# Patient Record
Sex: Female | Born: 1935 | Race: White | Hispanic: No | Marital: Single | Smoking: Never smoker
Health system: Southern US, Community
[De-identification: ages and names within clinical notes are randomized; demographics above are authoritative.]

## PROBLEM LIST (undated history)

## (undated) DIAGNOSIS — IMO0001 Reserved for inherently not codable concepts without codable children: Secondary | ICD-10-CM

## (undated) DIAGNOSIS — C50211 Malignant neoplasm of upper-inner quadrant of right female breast: Secondary | ICD-10-CM

## (undated) DIAGNOSIS — J189 Pneumonia, unspecified organism: Secondary | ICD-10-CM

## (undated) DIAGNOSIS — IMO0002 Reserved for concepts with insufficient information to code with codable children: Secondary | ICD-10-CM

## (undated) DIAGNOSIS — H9190 Unspecified hearing loss, unspecified ear: Secondary | ICD-10-CM

## (undated) DIAGNOSIS — C50919 Malignant neoplasm of unspecified site of unspecified female breast: Secondary | ICD-10-CM

## (undated) HISTORY — DX: Reserved for inherently not codable concepts without codable children: IMO0001

## (undated) HISTORY — DX: Reserved for concepts with insufficient information to code with codable children: IMO0002

## (undated) HISTORY — PX: WISDOM TOOTH EXTRACTION: SHX21

## (undated) HISTORY — PX: MASTECTOMY: SHX3

## (undated) HISTORY — PX: BREAST BIOPSY: SHX20

## (undated) HISTORY — DX: Malignant neoplasm of upper-inner quadrant of right female breast: C50.211

---
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
## 1979-12-08 HISTORY — PX: ABDOMINAL HYSTERECTOMY: SHX81

## 1979-12-08 HISTORY — PX: APPENDECTOMY: SHX54

## 2012-02-11 DIAGNOSIS — H538 Other visual disturbances: Secondary | ICD-10-CM | POA: Diagnosis not present

## 2012-02-11 DIAGNOSIS — H5315 Visual distortions of shape and size: Secondary | ICD-10-CM | POA: Diagnosis not present

## 2012-02-11 DIAGNOSIS — H259 Unspecified age-related cataract: Secondary | ICD-10-CM | POA: Diagnosis not present

## 2012-02-11 DIAGNOSIS — H35729 Serous detachment of retinal pigment epithelium, unspecified eye: Secondary | ICD-10-CM | POA: Diagnosis not present

## 2012-02-16 DIAGNOSIS — H43819 Vitreous degeneration, unspecified eye: Secondary | ICD-10-CM | POA: Diagnosis not present

## 2012-02-16 DIAGNOSIS — H353290 Exudative age-related macular degeneration, unspecified eye, stage unspecified: Secondary | ICD-10-CM | POA: Diagnosis not present

## 2012-02-16 DIAGNOSIS — H353190 Nonexudative age-related macular degeneration, unspecified eye, stage unspecified: Secondary | ICD-10-CM | POA: Diagnosis not present

## 2012-03-24 DIAGNOSIS — H353290 Exudative age-related macular degeneration, unspecified eye, stage unspecified: Secondary | ICD-10-CM | POA: Diagnosis not present

## 2012-04-21 DIAGNOSIS — H353290 Exudative age-related macular degeneration, unspecified eye, stage unspecified: Secondary | ICD-10-CM | POA: Diagnosis not present

## 2012-05-12 DIAGNOSIS — H353190 Nonexudative age-related macular degeneration, unspecified eye, stage unspecified: Secondary | ICD-10-CM | POA: Diagnosis not present

## 2012-05-12 DIAGNOSIS — H2510 Age-related nuclear cataract, unspecified eye: Secondary | ICD-10-CM | POA: Diagnosis not present

## 2012-05-12 DIAGNOSIS — H353290 Exudative age-related macular degeneration, unspecified eye, stage unspecified: Secondary | ICD-10-CM | POA: Diagnosis not present

## 2012-06-02 DIAGNOSIS — H353290 Exudative age-related macular degeneration, unspecified eye, stage unspecified: Secondary | ICD-10-CM | POA: Diagnosis not present

## 2012-06-30 DIAGNOSIS — H353290 Exudative age-related macular degeneration, unspecified eye, stage unspecified: Secondary | ICD-10-CM | POA: Diagnosis not present

## 2012-07-28 DIAGNOSIS — H353290 Exudative age-related macular degeneration, unspecified eye, stage unspecified: Secondary | ICD-10-CM | POA: Diagnosis not present

## 2012-08-23 DIAGNOSIS — H353290 Exudative age-related macular degeneration, unspecified eye, stage unspecified: Secondary | ICD-10-CM | POA: Diagnosis not present

## 2012-09-15 DIAGNOSIS — H353290 Exudative age-related macular degeneration, unspecified eye, stage unspecified: Secondary | ICD-10-CM | POA: Diagnosis not present

## 2012-10-12 DIAGNOSIS — H25019 Cortical age-related cataract, unspecified eye: Secondary | ICD-10-CM | POA: Diagnosis not present

## 2012-10-12 DIAGNOSIS — H43819 Vitreous degeneration, unspecified eye: Secondary | ICD-10-CM | POA: Diagnosis not present

## 2012-10-12 DIAGNOSIS — H5315 Visual distortions of shape and size: Secondary | ICD-10-CM | POA: Diagnosis not present

## 2012-10-12 DIAGNOSIS — H3530 Unspecified macular degeneration: Secondary | ICD-10-CM | POA: Diagnosis not present

## 2012-10-13 DIAGNOSIS — H353290 Exudative age-related macular degeneration, unspecified eye, stage unspecified: Secondary | ICD-10-CM | POA: Diagnosis not present

## 2012-11-10 DIAGNOSIS — H353290 Exudative age-related macular degeneration, unspecified eye, stage unspecified: Secondary | ICD-10-CM | POA: Diagnosis not present

## 2013-03-14 DIAGNOSIS — H353290 Exudative age-related macular degeneration, unspecified eye, stage unspecified: Secondary | ICD-10-CM | POA: Diagnosis not present

## 2013-04-11 DIAGNOSIS — H353290 Exudative age-related macular degeneration, unspecified eye, stage unspecified: Secondary | ICD-10-CM | POA: Diagnosis not present

## 2013-05-09 DIAGNOSIS — H353290 Exudative age-related macular degeneration, unspecified eye, stage unspecified: Secondary | ICD-10-CM | POA: Diagnosis not present

## 2013-06-13 DIAGNOSIS — H353290 Exudative age-related macular degeneration, unspecified eye, stage unspecified: Secondary | ICD-10-CM | POA: Diagnosis not present

## 2013-07-11 DIAGNOSIS — H353290 Exudative age-related macular degeneration, unspecified eye, stage unspecified: Secondary | ICD-10-CM | POA: Diagnosis not present

## 2013-07-14 DIAGNOSIS — H43819 Vitreous degeneration, unspecified eye: Secondary | ICD-10-CM | POA: Diagnosis not present

## 2013-07-14 DIAGNOSIS — H259 Unspecified age-related cataract: Secondary | ICD-10-CM | POA: Diagnosis not present

## 2013-07-14 DIAGNOSIS — H3530 Unspecified macular degeneration: Secondary | ICD-10-CM | POA: Diagnosis not present

## 2013-08-08 DIAGNOSIS — H353290 Exudative age-related macular degeneration, unspecified eye, stage unspecified: Secondary | ICD-10-CM | POA: Diagnosis not present

## 2013-09-05 DIAGNOSIS — H353290 Exudative age-related macular degeneration, unspecified eye, stage unspecified: Secondary | ICD-10-CM | POA: Diagnosis not present

## 2013-10-03 DIAGNOSIS — H353290 Exudative age-related macular degeneration, unspecified eye, stage unspecified: Secondary | ICD-10-CM | POA: Diagnosis not present

## 2013-11-07 DIAGNOSIS — H353290 Exudative age-related macular degeneration, unspecified eye, stage unspecified: Secondary | ICD-10-CM | POA: Diagnosis not present

## 2013-12-05 DIAGNOSIS — H353290 Exudative age-related macular degeneration, unspecified eye, stage unspecified: Secondary | ICD-10-CM | POA: Diagnosis not present

## 2014-01-02 DIAGNOSIS — H353290 Exudative age-related macular degeneration, unspecified eye, stage unspecified: Secondary | ICD-10-CM | POA: Diagnosis not present

## 2014-01-30 DIAGNOSIS — H353290 Exudative age-related macular degeneration, unspecified eye, stage unspecified: Secondary | ICD-10-CM | POA: Diagnosis not present

## 2014-02-27 DIAGNOSIS — H353290 Exudative age-related macular degeneration, unspecified eye, stage unspecified: Secondary | ICD-10-CM | POA: Diagnosis not present

## 2014-03-27 DIAGNOSIS — H353290 Exudative age-related macular degeneration, unspecified eye, stage unspecified: Secondary | ICD-10-CM | POA: Diagnosis not present

## 2014-04-24 DIAGNOSIS — H353190 Nonexudative age-related macular degeneration, unspecified eye, stage unspecified: Secondary | ICD-10-CM | POA: Diagnosis not present

## 2014-04-24 DIAGNOSIS — H353290 Exudative age-related macular degeneration, unspecified eye, stage unspecified: Secondary | ICD-10-CM | POA: Diagnosis not present

## 2014-05-01 DIAGNOSIS — H3530 Unspecified macular degeneration: Secondary | ICD-10-CM | POA: Diagnosis not present

## 2014-05-01 DIAGNOSIS — H524 Presbyopia: Secondary | ICD-10-CM | POA: Diagnosis not present

## 2014-05-01 DIAGNOSIS — H5200 Hypermetropia, unspecified eye: Secondary | ICD-10-CM | POA: Diagnosis not present

## 2014-05-01 DIAGNOSIS — H259 Unspecified age-related cataract: Secondary | ICD-10-CM | POA: Diagnosis not present

## 2014-05-24 DIAGNOSIS — H353290 Exudative age-related macular degeneration, unspecified eye, stage unspecified: Secondary | ICD-10-CM | POA: Diagnosis not present

## 2014-06-18 DIAGNOSIS — H353290 Exudative age-related macular degeneration, unspecified eye, stage unspecified: Secondary | ICD-10-CM | POA: Diagnosis not present

## 2014-07-17 DIAGNOSIS — H353290 Exudative age-related macular degeneration, unspecified eye, stage unspecified: Secondary | ICD-10-CM | POA: Diagnosis not present

## 2014-08-14 DIAGNOSIS — H353290 Exudative age-related macular degeneration, unspecified eye, stage unspecified: Secondary | ICD-10-CM | POA: Diagnosis not present

## 2014-08-14 DIAGNOSIS — H2510 Age-related nuclear cataract, unspecified eye: Secondary | ICD-10-CM | POA: Diagnosis not present

## 2014-08-14 DIAGNOSIS — H353190 Nonexudative age-related macular degeneration, unspecified eye, stage unspecified: Secondary | ICD-10-CM | POA: Diagnosis not present

## 2014-09-11 DIAGNOSIS — H3532 Exudative age-related macular degeneration: Secondary | ICD-10-CM | POA: Diagnosis not present

## 2014-10-10 DIAGNOSIS — H3532 Exudative age-related macular degeneration: Secondary | ICD-10-CM | POA: Diagnosis not present

## 2014-10-10 DIAGNOSIS — H3531 Nonexudative age-related macular degeneration: Secondary | ICD-10-CM | POA: Diagnosis not present

## 2014-10-15 DIAGNOSIS — H3532 Exudative age-related macular degeneration: Secondary | ICD-10-CM | POA: Diagnosis not present

## 2014-11-13 DIAGNOSIS — H3532 Exudative age-related macular degeneration: Secondary | ICD-10-CM | POA: Diagnosis not present

## 2014-12-12 DIAGNOSIS — H43813 Vitreous degeneration, bilateral: Secondary | ICD-10-CM | POA: Diagnosis not present

## 2014-12-12 DIAGNOSIS — H3531 Nonexudative age-related macular degeneration: Secondary | ICD-10-CM | POA: Diagnosis not present

## 2014-12-12 DIAGNOSIS — H3532 Exudative age-related macular degeneration: Secondary | ICD-10-CM | POA: Diagnosis not present

## 2014-12-20 DIAGNOSIS — N649 Disorder of breast, unspecified: Secondary | ICD-10-CM | POA: Diagnosis not present

## 2014-12-20 DIAGNOSIS — N6452 Nipple discharge: Secondary | ICD-10-CM | POA: Diagnosis not present

## 2015-01-09 ENCOUNTER — Other Ambulatory Visit: Payer: Self-pay | Admitting: Obstetrics and Gynecology

## 2015-01-09 DIAGNOSIS — N6452 Nipple discharge: Secondary | ICD-10-CM

## 2015-01-11 DIAGNOSIS — H43813 Vitreous degeneration, bilateral: Secondary | ICD-10-CM | POA: Diagnosis not present

## 2015-01-11 DIAGNOSIS — H3532 Exudative age-related macular degeneration: Secondary | ICD-10-CM | POA: Diagnosis not present

## 2015-01-11 DIAGNOSIS — H3531 Nonexudative age-related macular degeneration: Secondary | ICD-10-CM | POA: Diagnosis not present

## 2015-01-16 ENCOUNTER — Ambulatory Visit
Admission: RE | Admit: 2015-01-16 | Discharge: 2015-01-16 | Disposition: A | Discharge status: Home / Self Care | Payer: Medicare Other | Source: Ambulatory Visit | Attending: Obstetrics and Gynecology | Admitting: Obstetrics and Gynecology

## 2015-01-16 ENCOUNTER — Ambulatory Visit: Payer: Medicare Other

## 2015-01-16 ENCOUNTER — Other Ambulatory Visit: Payer: Self-pay | Admitting: Obstetrics and Gynecology

## 2015-01-16 DIAGNOSIS — N6452 Nipple discharge: Secondary | ICD-10-CM

## 2015-01-16 DIAGNOSIS — C50411 Malignant neoplasm of upper-outer quadrant of right female breast: Secondary | ICD-10-CM | POA: Diagnosis not present

## 2015-01-16 DIAGNOSIS — N63 Unspecified lump in breast: Secondary | ICD-10-CM | POA: Diagnosis not present

## 2015-01-16 DIAGNOSIS — C50919 Malignant neoplasm of unspecified site of unspecified female breast: Secondary | ICD-10-CM

## 2015-01-16 DIAGNOSIS — D0511 Intraductal carcinoma in situ of right breast: Secondary | ICD-10-CM | POA: Diagnosis not present

## 2015-01-16 HISTORY — DX: Malignant neoplasm of unspecified site of unspecified female breast: C50.919

## 2015-01-17 ENCOUNTER — Ambulatory Visit
Admission: RE | Admit: 2015-01-17 | Discharge: 2015-01-17 | Disposition: A | Discharge status: Home / Self Care | Payer: Medicare Other | Source: Ambulatory Visit | Attending: Obstetrics and Gynecology | Admitting: Obstetrics and Gynecology

## 2015-01-17 DIAGNOSIS — N6452 Nipple discharge: Secondary | ICD-10-CM

## 2015-01-18 ENCOUNTER — Ambulatory Visit
Admission: RE | Admit: 2015-01-18 | Discharge: 2015-01-18 | Disposition: A | Discharge status: Home / Self Care | Payer: Medicare Other | Source: Ambulatory Visit | Attending: Obstetrics and Gynecology | Admitting: Obstetrics and Gynecology

## 2015-01-18 ENCOUNTER — Other Ambulatory Visit: Payer: Self-pay | Admitting: Obstetrics and Gynecology

## 2015-01-18 DIAGNOSIS — C50911 Malignant neoplasm of unspecified site of right female breast: Secondary | ICD-10-CM

## 2015-01-18 DIAGNOSIS — D0511 Intraductal carcinoma in situ of right breast: Secondary | ICD-10-CM

## 2015-01-18 DIAGNOSIS — C50212 Malignant neoplasm of upper-inner quadrant of left female breast: Secondary | ICD-10-CM | POA: Diagnosis not present

## 2015-01-22 ENCOUNTER — Telehealth: Payer: Self-pay | Admitting: *Deleted

## 2015-01-22 ENCOUNTER — Encounter: Payer: Self-pay | Admitting: *Deleted

## 2015-01-22 DIAGNOSIS — C50211 Malignant neoplasm of upper-inner quadrant of right female breast: Secondary | ICD-10-CM

## 2015-01-22 HISTORY — DX: Malignant neoplasm of upper-inner quadrant of right female breast: C50.211

## 2015-01-22 NOTE — Telephone Encounter (Signed)
Confirmed [facility] for 01/30/15 at 1200 .  Instructions and contact information given. 

## 2015-01-28 ENCOUNTER — Ambulatory Visit
Admission: RE | Admit: 2015-01-28 | Discharge: 2015-01-28 | Disposition: A | Discharge status: Home / Self Care | Payer: Medicare Other | Source: Ambulatory Visit | Attending: Obstetrics and Gynecology | Admitting: Obstetrics and Gynecology

## 2015-01-28 DIAGNOSIS — C50911 Malignant neoplasm of unspecified site of right female breast: Secondary | ICD-10-CM

## 2015-01-28 DIAGNOSIS — N6452 Nipple discharge: Secondary | ICD-10-CM | POA: Diagnosis not present

## 2015-01-28 DIAGNOSIS — D0511 Intraductal carcinoma in situ of right breast: Secondary | ICD-10-CM | POA: Diagnosis not present

## 2015-01-28 DIAGNOSIS — C50411 Malignant neoplasm of upper-outer quadrant of right female breast: Secondary | ICD-10-CM | POA: Diagnosis not present

## 2015-01-28 MED ORDER — GADOBENATE DIMEGLUMINE 529 MG/ML IV SOLN
15.0000 mL | Freq: Once | INTRAVENOUS | Status: AC | PRN
  Administered 2015-01-28: 15 mL via INTRAVENOUS

## 2015-01-30 ENCOUNTER — Ambulatory Visit
Admission: RE | Admit: 2015-01-30 | Discharge: 2015-01-30 | Disposition: A | Discharge status: Home / Self Care | Payer: Medicare Other | Source: Ambulatory Visit | Attending: Radiation Oncology | Admitting: Radiation Oncology

## 2015-01-30 ENCOUNTER — Encounter: Payer: Self-pay | Admitting: Oncology

## 2015-01-30 ENCOUNTER — Ambulatory Visit: Payer: Medicare Other

## 2015-01-30 ENCOUNTER — Other Ambulatory Visit: Payer: Self-pay | Admitting: General Surgery

## 2015-01-30 ENCOUNTER — Other Ambulatory Visit (HOSPITAL_BASED_OUTPATIENT_CLINIC_OR_DEPARTMENT_OTHER): Payer: Medicare Other

## 2015-01-30 ENCOUNTER — Ambulatory Visit: Payer: Medicare Other | Admitting: Physical Therapy

## 2015-01-30 ENCOUNTER — Ambulatory Visit (HOSPITAL_BASED_OUTPATIENT_CLINIC_OR_DEPARTMENT_OTHER): Payer: Medicare Other | Admitting: Oncology

## 2015-01-30 VITALS — BP 149/78 | HR 96 | Temp 98.2°F | Resp 18 | Ht 62.0 in | Wt 167.6 lb

## 2015-01-30 DIAGNOSIS — C50411 Malignant neoplasm of upper-outer quadrant of right female breast: Secondary | ICD-10-CM

## 2015-01-30 DIAGNOSIS — C50211 Malignant neoplasm of upper-inner quadrant of right female breast: Secondary | ICD-10-CM

## 2015-01-30 DIAGNOSIS — Z7982 Long term (current) use of aspirin: Secondary | ICD-10-CM | POA: Diagnosis not present

## 2015-01-30 DIAGNOSIS — C50911 Malignant neoplasm of unspecified site of right female breast: Secondary | ICD-10-CM

## 2015-01-30 DIAGNOSIS — C50919 Malignant neoplasm of unspecified site of unspecified female breast: Secondary | ICD-10-CM

## 2015-01-30 DIAGNOSIS — Z90710 Acquired absence of both cervix and uterus: Secondary | ICD-10-CM | POA: Diagnosis not present

## 2015-01-30 DIAGNOSIS — L400 Psoriasis vulgaris: Secondary | ICD-10-CM | POA: Insufficient documentation

## 2015-01-30 DIAGNOSIS — Z170 Estrogen receptor positive status [ER+]: Secondary | ICD-10-CM

## 2015-01-30 DIAGNOSIS — H3530 Unspecified macular degeneration: Secondary | ICD-10-CM | POA: Diagnosis not present

## 2015-01-30 LAB — CBC WITH DIFFERENTIAL/PLATELET
BASO%: 1 % (ref 0.0–2.0)
Basophils Absolute: 0.1 10*3/uL (ref 0.0–0.1)
EOS%: 1.7 % (ref 0.0–7.0)
Eosinophils Absolute: 0.1 10*3/uL (ref 0.0–0.5)
HCT: 45.1 % (ref 34.8–46.6)
HGB: 14.5 g/dL (ref 11.6–15.9)
LYMPH%: 21.9 % (ref 14.0–49.7)
MCH: 28.4 pg (ref 25.1–34.0)
MCHC: 32.2 g/dL (ref 31.5–36.0)
MCV: 88.3 fL (ref 79.5–101.0)
MONO#: 0.6 10*3/uL (ref 0.1–0.9)
MONO%: 8.6 % (ref 0.0–14.0)
NEUT#: 4.7 10*3/uL (ref 1.5–6.5)
NEUT%: 66.8 % (ref 38.4–76.8)
Platelets: 247 10*3/uL (ref 145–400)
RBC: 5.12 10*6/uL (ref 3.70–5.45)
RDW: 13.6 % (ref 11.2–14.5)
WBC: 7.1 10*3/uL (ref 3.9–10.3)
lymph#: 1.5 10*3/uL (ref 0.9–3.3)

## 2015-01-30 LAB — COMPREHENSIVE METABOLIC PANEL (CC13)
ALT: 12 U/L (ref 0–55)
AST: 19 U/L (ref 5–34)
Albumin: 3.7 g/dL (ref 3.5–5.0)
Alkaline Phosphatase: 77 U/L (ref 40–150)
Anion Gap: 11 mEq/L (ref 3–11)
BILIRUBIN TOTAL: 0.59 mg/dL (ref 0.20–1.20)
BUN: 12.6 mg/dL (ref 7.0–26.0)
CHLORIDE: 106 meq/L (ref 98–109)
CO2: 25 mEq/L (ref 22–29)
Calcium: 9.7 mg/dL (ref 8.4–10.4)
Creatinine: 0.8 mg/dL (ref 0.6–1.1)
EGFR: 67 mL/min/{1.73_m2} — AB (ref >90)
Glucose: 101 mg/dl (ref 70–140)
POTASSIUM: 4.4 meq/L (ref 3.5–5.1)
SODIUM: 142 meq/L (ref 136–145)
TOTAL PROTEIN: 6.7 g/dL (ref 6.4–8.3)

## 2015-01-30 NOTE — Progress Notes (Signed)
Regina Cardenas  Telephone:([phone]) [phone] Fax:(336) [phone]     ID: Regina Cardenas DOB: 01/10/1929  MR#: [id number]  WTU#:[id number]  Patient Care Team: No Pcp Per Patient as PCP - General (General Practice) Fanny Skates, MD as Consulting Physician (General Surgery) Chauncey Cruel, MD as Consulting Physician (Oncology) Jodelle Gross, MD as Consulting Physician (Radiation Oncology) Roselee Culver, RN as Registered Nurse Woodworth, RN as Registered Nurse PCP: No PCP Per Patient GYN: Gus Height MD SU:  OTHER MD: Juanito Doom MD  CHIEF COMPLAINT:  Estrogen receptor positive breast cancer  CURRENT TREATMENT:  Awaiting definitive surgery   BREAST CANCER HISTORY:  Regina Cardenas  Noted some drainage on her bed sheets sometime in October 2015. She  Found it was coming from her right nipple, and treated with Neosporin. That cleared it for the next 2 months, but the problem resumed in January and at that point she decided to seek medical help. She saw Dr. Harrington Challenger and was referred to the breast Center where on 01/16/2015 she underwent bilateral diagnostic mammography with right breast ultrasonography. The breast density was category B. The left breast was unremarkable. In the upper outer quadrant of the right breast there were coarse pleomorphic calcifications spanning 9.6 cm. There was associated asymmetry and in the same quadrant he was a spiculated mass measuring 1.9 cm. A second mass anterior to this by almost 2 inches measured 1.0 cm. There was also thickening of the right nipple mammographically. On exam, the right breast mass at 10:00 was palpable and there was erythema of the right nipple concerning for Paget's disease. [facility] ultrasound showed an irregular hypoechoic mass in the right breast at the 10:00 position 7 cm from the nipple measuring 1.3 cm. There were numerous smaller hypoechoic nodules extending towards the nipple including a hypoechoic mass at 10:00 4  cm from the nipple measuring 0.7 cm and another one 6 cm from the nipple measuring 0.6 cm. The right axilla did not show any abnormal adenopathy.   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  Bilateral breast MRI obtained 01/28/2015 again showed no abnormality in the left breast and no abnormal appearing lymph nodes. In the right breast there was thickening of the nipple an asymmetric regional enhancement in the upper outer quadrant measuring up to 11.6 cm anterior posteriorly. In addition imaging through the upper abdomen showed an indeterminate 1.47 cm lesion in the left  Hepatic lobe.    the patient's subsequent history is as detailed below  INTERVAL HISTORY: Regina Cardenas  Was evaluated in the multidisciplinary breast cancer clinic 01/30/2015 accompanied by her daughter Regina Cardenas. The patient's case was also presented at the multidisciplinary breast cancer conference that same morning. A preliminary plan was suggested including initial mastectomy with radiation therapy plans depending on the pathology results, local treatment to be followed by antiestrogen therapy.  REVIEW OF SYSTEMS:   Aside from the nipple discharge itself, there were no specific symptoms leading to the original mammogram, which was routinely scheduled. The patient denies unusual headaches, visual changes, nausea, vomiting, stiff neck, dizziness, or gait imbalance. There has been no cough, phlegm production, or pleurisy, no chest pain or pressure, and no change in bowel or bladder habits. The patient denies fever, rash, bleeding, unexplained fatigue or unexplained weight loss.  Maree does have a history of psoriasis. A detailed review of systems was otherwise entirely negative.  PAST MEDICAL HISTORY: Past  Medical History  Diagnosis Date  . Breast cancer of upper-inner quadrant of right female breast 01/22/2015    PAST SURGICAL HISTORY: Past Surgical History  Procedure Laterality Date  . Abdominal hysterectomy    . Mouth surgery      FAMILY HISTORY Family History  Problem Relation Age of Onset  . Rectal cancer Maternal Aunt   . Brain cancer Maternal Aunt   . Non-Hodgkin's lymphoma Maternal Uncle   . Stomach cancer Maternal Grandmother   . Breast cancer Maternal Aunt    Patient's father died at the age of 66 following a stroke. The patient's mother died just short of 16. The patient had one brother, no sisters. On the mother's side , out of 11 siblings, there was an aunt with breast cancer in her 38s,  And other & uncles with brain, lung, and  Non-Hodgkin's lymphoma. The patient's maternal grandmother had stomach cancer. None of these patients were diagnosed at an early age as far as the patient knows. On the father's side 1 paternal aunt had "some kind of cancer ". She was not a young person when diagnosed  GYNECOLOGIC HISTORY:  No LMP recorded.  menarche age 81,  The patient is GX P0. She underwent total abdominal hysterectomy with bilateral stopping the oophorectomy at age 37. She took hormone replacement for a few months.  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 She is widowed and lives alone with her spitz, [place].  Her 2 children are adopted. Regina Cardenas lives in [place] where he works as an Marine scientist. 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 The patient has 2 grandchildren. "In my heart I am an Ep[employer]alian".    ADVANCED DIRECTIVES:  Not in place. During her [phone] visit the patient was given the appropriate documents 2 complete and notarize at her discretion.   HEALTH MAINTENANCE: History  Substance Use Topics  . Smoking status: Never Smoker   . Smokeless tobacco: Not on file  . Alcohol  Use: No     Colonoscopy:  PAP: status post hysterectomy  Bone density:  Lipid panel:  No Known Allergies  Current Outpatient Prescriptions  Medication Sig Dispense Refill  . Aflibercept (EYLEA IO) Inject into the eye Nightly. Every 4 weeks    . aspirin 325 MG EC tablet Take 325 mg by mouth daily.    . Multiple Vitamins-Minerals (ICAPS MV) TABS Take by mouth daily.    . Multiple Vitamins-Minerals (PRESERVISION AREDS 2 PO) Take 2 tablets by mouth daily.    Marland Kitchen tobramycin-dexamethasone (TOBRADEX) ophthalmic solution Place 1 drop into both eyes every 4 (four) hours while awake. Use before Eylea treatment     No current facility-administered medications for this visit.    OBJECTIVE:  Elderly white woman in no acute distress Filed Vitals:   01/30/15 1253  BP: 149/78  Pulse: 96  Temp: 98.2 F (36.8 C)  Resp: 18     Body mass index is 30.65 kg/(m^2).    ECOG FS:0 - Asymptomatic  Ocular: Sclerae unicteric,  EOMs intact Ear-nose-throat: Oropharynx clear and moist Lymphatic: No cervical or supraclavicular adenopathy Lungs no rales or rhonchi, good excursion bilaterally Heart regular rate and rhythm, no murmur appreciated Abd soft, nontender, positive bowel sounds MSK no focal spinal tenderness, no upper extremity lymphedema Neuro: non-focal,  well-oriented, appropriate affect Breasts:  The right breast is status post recent biopsy. There is moderate ecchymosis, but no palpable mass. The nipple appears slightly crusted over. It was biopsied earlier today. The left axilla is benign. The right breast is unremarkable     LAB RESULTS:  CMP     Component Value Date/Time   NA 142 01/30/2015 1233   K 4.4 01/30/2015 1233   CO2 25 01/30/2015 1233   GLUCOSE 101 01/30/2015 1233   BUN 12.6 01/30/2015 1233   CREATININE 0.8 01/30/2015 1233   CALCIUM 9.7 01/30/2015 1233   PROT 6.7 01/30/2015 1233   ALBUMIN 3.7 01/30/2015 1233   AST 19 01/30/2015 1233   ALT 12 01/30/2015 1233   ALKPHOS 77  01/30/2015 1233   BILITOT 0.59 01/30/2015 1233    INo results found for: SPEP, UPEP  Lab Results  Component Value Date   WBC 7.1 01/30/2015   NEUTROABS 4.7 01/30/2015   HGB 14.5 01/30/2015   HCT 45.1 01/30/2015   MCV 88.3 01/30/2015   PLT 247 01/30/2015      Chemistry      Component Value Date/Time   NA 142 01/30/2015 1233   K 4.4 01/30/2015 1233   CO2 25 01/30/2015 1233   BUN 12.6 01/30/2015 1233   CREATININE 0.8 01/30/2015 1233      Component Value Date/Time   CALCIUM 9.7 01/30/2015 1233   ALKPHOS 77 01/30/2015 1233   AST 19 01/30/2015 1233   ALT 12 01/30/2015 1233   BILITOT 0.59 01/30/2015 1233       No results found for: LABCA2  No components found for: LABCA125  No results for input(s): INR in the last 168 hours.  Urinalysis No results found for: COLORURINE, APPEARANCEUR, LABSPEC, PHURINE, GLUCOSEU, HGBUR, BILIRUBINUR, KETONESUR, PROTEINUR, UROBILINOGEN, NITRITE, LEUKOCYTESUR  STUDIES: Mr Breast Bilateral W Wo Contrast  01/28/2015   CLINICAL DATA:  81 year old female who presented with a palpable abnormality in the 10 o'clock region of the right breast, spontaneous bloody nipple discharge and an erythematous right nipple. Ultrasound-guided core biopsy was performed showing invasive and in situ carcinoma in the 10 o'clock region of the breast.  LABS:  None obtained at the time of imaging.  EXAM: BILATERAL BREAST MRI WITH AND WITHOUT CONTRAST  TECHNIQUE: Multiplanar, multisequence MR images of both breasts were obtained prior to and following the intravenous administration of 15 ml of Multihance.  THREE-DIMENSIONAL MR IMAGE RENDERING ON INDEPENDENT WORKSTATION:  Three-dimensional MR images were rendered by post-processing of the original MR data on an independent workstation. The three-dimensional MR images were interpreted, and findings are reported in the following complete MRI report for this study. Three dimensional images were evaluated at the independent DynaCad  workstation  COMPARISON:  Diagnostic mammogram and ultrasound dated 01/16/2015.  FINDINGS: Breast composition: b.  Scattered fibroglandular tissue.  Background parenchymal enhancement: Mild  Right breast: There is enhancement and thickening of the right nipple concerning for Paget's disease. There is asymmetric regional enhancement in the upper-outer quadrant of the right breast measuring 11.6 cm anterior posteriorly, 4.6 cm transversely and 4.0 cm cranial caudally.  Left breast: No mass or abnormal enhancement.  Lymph nodes: No abnormal appearing lymph nodes.  Ancillary findings: Imaging through the upper abdomen shows an indeterminate 1.4 cm lesion in the left hepatic lobe.  IMPRESSION: Enhancement of the right nipple concerning for Paget's disease. 11.6 cm of abnormal enhancement in the upper-outer quadrant of the right breast consistent with invasive and ductal carcinoma in-situ.  RECOMMENDATION:  Treatment planning of the patient's known right breast invasive and in situ carcinoma is recommended. If it is felt clinically necessary to prove the extent of disease punch biopsy of the right nipple, stereotactic biopsy of calcifications or sonographic biopsy of a more anterior nodule could be performed.  Indeterminate 1.4 lesion in the left hepatic lobe of the liver. Further evaluation with CT scan is recommended.  BI-RADS CATEGORY  6: Known biopsy-proven malignancy.   Electronically Signed   By: Lillia Mountain M.D.   On: 01/28/2015 11:14   Mm Digital Diagnostic Bilat  01/16/2015   CLINICAL DATA:  81 year old female complaining of a palpable abnormality in the 10 o'clock region of the right breast 7 cm from the nipple, spontaneous bloody nipple discharge and an erythematous right nipple.  EXAM: DIGITAL DIAGNOSTIC BILATERAL MAMMOGRAM WITH CAD  ULTRASOUND RIGHT BREAST  COMPARISON:  None.  ACR Breast Density Category b: There are scattered areas of fibroglandular density.  FINDINGS: No suspicious mass or malignant type  microcalcifications identified in the left breast.  In the upper-outer quadrant of the right breast there are coarse pleomorphic calcifications in a segmental pattern spanning an area 9.6 cm. There is associated distortion and asymmetric density. In the upper-outer quadrant of the posterior third of the breast there is discrete spiculated mass measuring 1.9 x 1.8 x 1.3 cm. 4.6 cm anterior to this is a mass measuring 1.0 x 0.7 x 0.4 cm. There is visible thickening of the right nipple mammographically.  Mammographic images were processed with CAD.  On physical exam, I palpate a discrete mass in the right breast at 10 o'clock 7 cm from the nipple. There is erythema of the right nipple concerning for Paget's disease.  Targeted ultrasound is performed, showing an irregular hypoechoic mass in the right breast at 10 o'clock 7 cm from the nipple measuring 1.0 x 1.3 x 1.1 cm. There are numerous smaller hypoechoic nodules extending towards the nipple. There is a hypoechoic mass at 10 o'clock 4 cm from the nipple measuring 0.6 x 0.5 x 0.7 cm. There is a 5 x 5 x 6 mm hypoechoic nodule in the right breast at 10 o'clock 6 cm from the nipple.  Sonographic evaluation of the right axilla does not show any enlarged adenopathy.  IMPRESSION: Imaging and clinical findings are concerning for invasive ductal carcinoma, ductal carcinoma in-situ and Paget's disease of the right nipple.  RECOMMENDATION: Ultrasound-guided core biopsy of the mass in the right breast at 10 o'clock 7 cm from the nipple will be performed and dictated separately. If the biopsy results are positive for malignancy I would recommend punch biopsy of the nipple or if clinically indicated second biopsy of a more anterior nodule to prove the extent of disease.  I have discussed the findings and recommendations with the patient. Results were also provided in writing at the conclusion of the visit. If applicable, a reminder letter will be sent to the patient regarding the  next appointment.  BI-RADS CATEGORY  5: Highly suggestive of malignancy.   Electronically Signed   By: Lillia Mountain M.D.   On: 01/16/2015 15:42   Mm Digital Diagnostic Unilat R  01/17/2015   ADDENDUM REPORT: 01/17/2015 14:06  ADDENDUM: The patient returned today 01/17/2015 for results. However, the final pathology results are delayed until tomorrow due to the need for additional analysis. Susa Raring, nurse navigator at the [facility] attempted to call the patient earlier today in order to inform her of the delay,  but she was unable to reach the patient. I relayed this information to the patient when she arrived here today.  The patient denied significant pain or bleeding at the biopsy site. The biopsy site is soft and dry, and there is no palpable hematoma.  The patient wishes to return tomorrow in order to receive her results in person. She has been scheduled for 01/18/2015 at 2 o'clock p.m.   Electronically Signed   By: Evangeline Dakin M.D.   On: 01/17/2015 14:06   01/17/2015   CLINICAL DATA:  Status post ultrasound-guided core biopsy of the right breast.  EXAM: DIAGNOSTIC RIGHT MAMMOGRAM POST ULTRASOUND BIOPSY  COMPARISON:  None.  FINDINGS: Mammographic images were obtained following ultrasound guided biopsy of a mass in the 10 o'clock region the right breast. Mammographic images showed there is a ribbon shaped clip in the posterior third of the upper-outer quadrant of right breast associated with the suspicious mass.  IMPRESSION: Status post ultrasound-guided core biopsy of the right breast with pathology pending.  Final Assessment: Post Procedure Mammograms for Marker Placement  Electronically Signed: By: Lillia Mountain M.D. On: 01/16/2015 16:03   Mm Radiologist Eval And Mgmt  01/18/2015   CONSULTATION: HPI: The patient returns for a followup after ultrasound-guided biopsy of a mass in the upper-outer right breast at 10 o'clock. The patient is doing well and denies any biopsy site  complications.  Pathology results: Pathology results from ultrasound-guided biopsy of the mass in the right breast at 10 o'clock revealed invasive and in situ breast carcinoma. This is concordant with the imaging findings.  Biopsy site: The patient's biopsy site was examined yesterday. Clean, dry, and intact. Steri-Strips overlie small skin incision.  PLAN: All the patient's questions were answered. She is aware of the results and demonstrates understanding and has received information material regarding her diagnosis. Depending on the results of the MRI, the patient may need stereotactic guided biopsy of calcifications in her right breast to determine extent of disease.  The patient is scheduled to be seen in multidisciplinary Clinic 01/30/2015 at 9:30 a.m. A breast MRI is scheduled for 01/28/2015 at 7 a.m.  She has been instructed to call the breast center with any questions or concerns.   Electronically Signed   By: Everlean Alstrom M.D.   On: 01/18/2015 14:41   US Breast Ltd Uni Right Inc Axilla  01/16/2015   CLINICAL DATA:  80 year old female complaining of a palpable abnormality in the 10 o'clock region of the right breast 7 cm from the nipple, spontaneous bloody nipple discharge and an erythematous right nipple.  EXAM: DIGITAL DIAGNOSTIC BILATERAL MAMMOGRAM WITH CAD  ULTRASOUND RIGHT BREAST  COMPARISON:  None.  ACR Breast Density Category b: There are scattered areas of fibroglandular density.  FINDINGS: No suspicious mass or malignant type microcalcifications identified in the left breast.  In the upper-outer quadrant of the right breast there are coarse pleomorphic calcifications in a segmental pattern spanning an area 9.6 cm. There is associated distortion and asymmetric density. In the upper-outer quadrant of the posterior third of the breast there is discrete spiculated mass measuring 1.9 x 1.8 x 1.3 cm. 4.6 cm anterior to this is a mass measuring 1.0 x 0.7 x 0.4 cm. There is visible thickening of the  right nipple mammographically.  Mammographic images were processed with CAD.  On physical exam, I palpate a discrete mass in the right breast at 10 o'clock 7 cm from the nipple. There is erythema of the right nipple concerning  for Paget's disease.  Targeted ultrasound is performed, showing an irregular hypoechoic mass in the right breast at 10 o'clock 7 cm from the nipple measuring 1.0 x 1.3 x 1.1 cm. There are numerous smaller hypoechoic nodules extending towards the nipple. There is a hypoechoic mass at 10 o'clock 4 cm from the nipple measuring 0.6 x 0.5 x 0.7 cm. There is a 5 x 5 x 6 mm hypoechoic nodule in the right breast at 10 o'clock 6 cm from the nipple.  Sonographic evaluation of the right axilla does not show any enlarged adenopathy.  IMPRESSION: Imaging and clinical findings are concerning for invasive ductal carcinoma, ductal carcinoma in-situ and Paget's disease of the right nipple.  RECOMMENDATION: Ultrasound-guided core biopsy of the mass in the right breast at 10 o'clock 7 cm from the nipple will be performed and dictated separately. If the biopsy results are positive for malignancy I would recommend punch biopsy of the nipple or if clinically indicated second biopsy of a more anterior nodule to prove the extent of disease.  I have discussed the findings and recommendations with the patient. Results were also provided in writing at the conclusion of the visit. If applicable, a reminder letter will be sent to the patient regarding the next appointment.  BI-RADS CATEGORY  5: Highly suggestive of malignancy.   Electronically Signed   By: Lillia Mountain M.D.   On: 01/16/2015 15:42   [place] Rt Breast Bx W Loc Dev 1st Lesion Img Bx Spec US Guide  01/16/2015   CLINICAL DATA:  81 year old female complaining of a palpable mass in the 10 o'clock region of the right breast, spontaneous bloody nipple discharge in and erythematous right nipple. Sonographically a suspicious mass was seen in the right breast at 10  o'clock 7 cm from the nipple.  EXAM: ULTRASOUND GUIDED RIGHT BREAST CORE NEEDLE BIOPSY  COMPARISON:  Previous exam(s).  FINDINGS: I met with the patient and we discussed the procedure of ultrasound-guided biopsy, including benefits and alternatives. We discussed the high likelihood of a successful procedure. We discussed the risks of the procedure, including infection, bleeding, tissue injury, clip migration, and inadequate sampling. Informed written consent was given. The usual time-out protocol was performed immediately prior to the procedure.  Using sterile technique and 2% Lidocaine as local anesthetic, under direct ultrasound visualization, a 14 gauge spring-loaded device was used to perform biopsy of a mass in the 10 o'clock region the right breast 7 cm from the nipple using an inferior to superior approach. At the conclusion of the procedure a ribbon shaped tissue marker clip was deployed into the biopsy cavity. Follow up 2 view mammogram was performed and dictated separately.  IMPRESSION: Ultrasound guided biopsy of the right breast. No apparent complications.   Electronically Signed   By: Lillia Mountain M.D.   On: 01/16/2015 15:45    ASSESSMENT: 81 y.o. Regina Cardenas woman  Status post right breast biopsy 01/16/2015 for a clinical T1-T3, N0, stage I or II  Invasive ductal carcinoma, grade 2, estrogen and progesterone receptor positive, HER-2 negative, with an MIB-1 of 19%   (1) definitive surgery pending , most likely requiring mastectomy.  Radiation oncology has requested sentinel lymph node sampling to help clarify the radiation question   (2) at the completion of local therapy , the patient will start anti-estrogens, most likely with anastrozole.  PLAN: We spent the better part of today's hour-long appointment discussing the biology of breast cancer in general, and the specifics of the patient's tumor in  particular. Joeann understands in her case we feel a mastectomy is unavoidable. On the other hand  we do not see evidence of lymph node involvement. For that reason it is not clear at this point whether or not she will need radiation.  She will definitely benefit from systemic therapy. She is not a candidate for anti-HER-2 immunotherapy. In general I prefer to avoid chemotherapy in older patients, since the survival advantage is essentially nil when they are estrogen receptor positive.  Accordingly we spent a great deal of today's visit discussing anti-estrogens. I gave her information on anastrozole compared to tamoxifen. She has a good understanding of the possible toxicities, side effects as well as complications of both types of agents.  I think in her case it would be better to start with anastrozole. If she tolerates it well we will obtain a bone density and if there is any issue with that she will receive zolendronate on a yearly basis.  She is very comfortable with this plan. I will see her again in about a month, by which time she should've had her definitive surgery. I offered her follow-up with my partners in [place] but she tells me she prefers to have all her medical care in [place] and we will be glad to undergo that request.  The liver abnormality incidentally noted on her breast MRI will be evaluated further with a liver ultrasound within the next week.  I also gave her a copy of the health care power of attorney/living will document and urged her to go ahead and get it completed and notarized prior to her next visit.  The patient has a good understanding of the overall plan. She agrees with it. She knows the goal of treatment in her case is cure. She will call with any problems that may develop before her next visit here.  Chauncey Cruel, MD   01/30/2015 4:20 PM Medical Oncology and Hematology [facility] [address] [place], [place] [address] Tel. [phone]    Fax. [phone]

## 2015-01-30 NOTE — Progress Notes (Signed)
Checked in new pt with no financial concerns prior to seeing the dr. Informed pt if chemo is part of her treatment Raquel will contact foundations that offer copay assistance for chemo if needed. She has Raquel's card for any billing questions or concerns.

## 2015-01-30 NOTE — Progress Notes (Signed)
Ms. Heick is a very pleasant 81 y.o. 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  Biopsy results revealed the tumor's hormone status as ER positive, PR positive, and HER2/neu negative. Ki67 is 19%.  She presents today with her daughter to the [facility]) for treatment consideration and recommendations from the breast surgeon, radiation oncologist, and medical oncologist.     I briefly met with Ms. Torrico and her daughter during her [facility] visit today. We discussed the purpose of the Survivorship Clinic, which will include monitoring for recurrence, coordinating completion of age and gender-appropriate cancer screenings, promotion of overall wellness, as well as managing potential late/long-term side effects of anti-cancer treatments.    As of today, the treatment plan for Ms. Minier is being finalized as we pursue additional testing and biopsies.  At this time, the intent of treatment for Ms. Howley is cure, therefore she will be eligible for the Survivorship Clinic upon her completion of treatment.  Her survivorship care plan (SCP) document will be drafted and updated throughout the course of her treatment trajectory. She will receive the SCP in an office visit with myself in the Survivorship Clinic once she has completed treatment.   Ms. Rowley was encouraged to ask questions and all questions were answered to her satisfaction.  She was given my business card and encouraged to contact me with any concerns regarding survivorship.  I look forward to participating in her care.   Mike Craze, NP [place] [phone]

## 2015-02-01 ENCOUNTER — Encounter: Payer: Self-pay | Admitting: Radiation Oncology

## 2015-02-01 NOTE — Progress Notes (Signed)
Radiation Oncology         [phone] ________________________________  Name: Regina Cardenas MRN: [id number]  Date: 01/30/2015  DOB: 1929/06/25  CC:No PCP Per Patient  Fanny Skates, MD     REFERRING PHYSICIAN: Fanny Skates, MD   DIAGNOSIS: The encounter diagnosis was Breast cancer of upper-inner quadrant of right female breast.  Breast cancer of upper-inner quadrant of right female breast   Staging form: Breast, AJCC 7th Edition     Clinical stage from 01/30/2015: Stage IIB (T3, N0, M0) - Unsigned    HISTORY OF PRESENT ILLNESS::Regina Cardenas is a 81 y.o. female who is seen for an initial consultation visit regarding the patient's diagnosis of breast cancer.  The patient was found to have suspicious findings within the right breast on mammogram. The patient has had symptoms prior to this study: Nipple discharge which she states began in October. This seemed to get better but started again more recently.. A diagnostic mammogram and breast ultrasound confirmed this finding. On mammogram, pleomorphic calcifications are present within the upper outer quadrant of the right breast spanning an area of 9.6 cm. A discrete 1.8 cm spiculated mass is present within the posterior third in the upper outer quadrant and anterior to this was a 1.0 cm mass. Thickening of the right nipple was also seen on mammogram. On ultrasound, the tumor measured 1.6 cm and was present in the upper outer quadrant and additional abnormalities were noted consistent with a complex findings on mammogram.  A biopsy was performed. This revealed invasive ductal carcinoma with associated DCIS. Receptors studies were completed and indicate that the tumor is estrogen receptor positive, progesterone receptor positive, and Her-2/neu negative. The Ki-67 staining was 19 %.   The patient has undergone an MRI scan of the breasts: This revealed findings concerning for Paget's disease involving the right nipple. There also was an  11.6 cm area of abnormal enhancement in the upper outer quadrant of the right breast consistent with a combination of invasive cancer and in situ disease.    The patient is seen today in multidisciplinary breast clinic after her case was discussed in conference this morning.    PREVIOUS RADIATION THERAPY: No   PAST MEDICAL HISTORY:  has a past medical history of Breast cancer of upper-inner quadrant of right female breast (01/22/2015).     PAST SURGICAL HISTORY: Past Surgical History  Procedure Laterality Date  . Abdominal hysterectomy    . Mouth surgery       FAMILY HISTORY: family history includes Brain cancer in her maternal aunt; Breast cancer in her maternal aunt; Non-Hodgkin's lymphoma in her maternal uncle; Rectal cancer in her maternal aunt; Stomach cancer in her maternal grandmother.   SOCIAL HISTORY:  reports that she has never smoked. She does not have any smokeless tobacco history on file. She reports that she does not drink alcohol or use illicit drugs.   ALLERGIES: Review of patient's allergies indicates no known allergies.   MEDICATIONS:  Current Outpatient Prescriptions  Medication Sig Dispense Refill  . Aflibercept (EYLEA IO) Inject into the eye Nightly. Every 4 weeks    . aspirin 325 MG EC tablet Take 325 mg by mouth daily.    . Multiple Vitamins-Minerals (ICAPS MV) TABS Take by mouth daily.    . Multiple Vitamins-Minerals (PRESERVISION AREDS 2 PO) Take 2 tablets by mouth daily.    Marland Kitchen tobramycin-dexamethasone (TOBRADEX) ophthalmic solution Place 1 drop into both eyes every 4 (four) hours while awake. Use before Eylea treatment  No current facility-administered medications for this encounter.     REVIEW OF SYSTEMS:  A 15 point review of systems is documented in the electronic medical record. This was obtained by the nursing staff. However, I reviewed this with the patient to discuss relevant findings and make appropriate changes.  Pertinent items are noted  in HPI.    PHYSICAL EXAM:  vitals were not taken for this visit.  ECOG = 1  0 - Asymptomatic (Fully active, able to carry on all predisease activities without restriction)  1 - Symptomatic but completely ambulatory (Restricted in physically strenuous activity but ambulatory and able to carry out work of a light or sedentary nature. For example, light housework, office work)  2 - Symptomatic, <50% in bed during the day (Ambulatory and capable of all self care but unable to carry out any work activities. Up and about more than 50% of waking hours)  3 - Symptomatic, >50% in bed, but not bedbound (Capable of only limited self-care, confined to bed or chair 50% or more of waking hours)  4 - Bedbound (Completely disabled. Cannot carry on any self-care. Totally confined to bed or chair)  5 - Death   Eustace Pen MM, Creech RH, Tormey DC, et al. [phone]). "Toxicity and response criteria of the [facility]". [place] Oncol. 5 (6): 649-55  General: Well-developed, in no acute distress HEENT: Normocephalic, atraumatic; oral cavity clear Neck: Supple without any lymphadenopathy Cardiovascular: Regular rate and rhythm Respiratory: Clear to auscultation bilaterally Breasts: The right breast is status post biopsy. I cannot palpate any discrete mass today. The nipple is red and enlarged. No axillary adenopathy. The left breast is unremarkable and the left axilla did not demonstrate significant lymphadenopathy. GI: Soft, nontender, normal bowel sounds Extremities: No edema present Neuro: No focal deficits     LABORATORY DATA:  Lab Results  Component Value Date   WBC 7.1 01/30/2015   HGB 14.5 01/30/2015   HCT 45.1 01/30/2015   MCV 88.3 01/30/2015   PLT 247 01/30/2015   Lab Results  Component Value Date   NA 142 01/30/2015   K 4.4 01/30/2015   CO2 25 01/30/2015   Lab Results  Component Value Date   ALT 12 01/30/2015   AST 19 01/30/2015   ALKPHOS 77 01/30/2015    BILITOT 0.59 01/30/2015      RADIOGRAPHY: Mr Breast Bilateral W Wo Contrast  01/28/2015   CLINICAL DATA:  81 year old female who presented with a palpable abnormality in the 10 o'clock region of the right breast, spontaneous bloody nipple discharge and an erythematous right nipple. Ultrasound-guided core biopsy was performed showing invasive and in situ carcinoma in the 10 o'clock region of the breast.  LABS:  None obtained at the time of imaging.  EXAM: BILATERAL BREAST MRI WITH AND WITHOUT CONTRAST  TECHNIQUE: Multiplanar, multisequence MR images of both breasts were obtained prior to and following the intravenous administration of 15 ml of Multihance.  THREE-DIMENSIONAL MR IMAGE RENDERING ON INDEPENDENT WORKSTATION:  Three-dimensional MR images were rendered by post-processing of the original MR data on an independent workstation. The three-dimensional MR images were interpreted, and findings are reported in the following complete MRI report for this study. Three dimensional images were evaluated at the independent DynaCad workstation  COMPARISON:  Diagnostic mammogram and ultrasound dated 01/16/2015.  FINDINGS: Breast composition: b.  Scattered fibroglandular tissue.  Background parenchymal enhancement: Mild  Right breast: There is enhancement and thickening of the right nipple concerning for Paget's disease.  There is asymmetric regional enhancement in the upper-outer quadrant of the right breast measuring 11.6 cm anterior posteriorly, 4.6 cm transversely and 4.0 cm cranial caudally.  Left breast: No mass or abnormal enhancement.  Lymph nodes: No abnormal appearing lymph nodes.  Ancillary findings: Imaging through the upper abdomen shows an indeterminate 1.4 cm lesion in the left hepatic lobe.  IMPRESSION: Enhancement of the right nipple concerning for Paget's disease. 11.6 cm of abnormal enhancement in the upper-outer quadrant of the right breast consistent with invasive and ductal carcinoma in-situ.   RECOMMENDATION: Treatment planning of the patient's known right breast invasive and in situ carcinoma is recommended. If it is felt clinically necessary to prove the extent of disease punch biopsy of the right nipple, stereotactic biopsy of calcifications or sonographic biopsy of a more anterior nodule could be performed.  Indeterminate 1.4 lesion in the left hepatic lobe of the liver. Further evaluation with CT scan is recommended.  BI-RADS CATEGORY  6: Known biopsy-proven malignancy.   Electronically Signed   By: Lillia Mountain M.D.   On: 01/28/2015 11:14   Mm Digital Diagnostic Bilat  01/16/2015   CLINICAL DATA:  81 year old female complaining of a palpable abnormality in the 10 o'clock region of the right breast 7 cm from the nipple, spontaneous bloody nipple discharge and an erythematous right nipple.  EXAM: DIGITAL DIAGNOSTIC BILATERAL MAMMOGRAM WITH CAD  ULTRASOUND RIGHT BREAST  COMPARISON:  None.  ACR Breast Density Category b: There are scattered areas of fibroglandular density.  FINDINGS: No suspicious mass or malignant type microcalcifications identified in the left breast.  In the upper-outer quadrant of the right breast there are coarse pleomorphic calcifications in a segmental pattern spanning an area 9.6 cm. There is associated distortion and asymmetric density. In the upper-outer quadrant of the posterior third of the breast there is discrete spiculated mass measuring 1.9 x 1.8 x 1.3 cm. 4.6 cm anterior to this is a mass measuring 1.0 x 0.7 x 0.4 cm. There is visible thickening of the right nipple mammographically.  Mammographic images were processed with CAD.  On physical exam, I palpate a discrete mass in the right breast at 10 o'clock 7 cm from the nipple. There is erythema of the right nipple concerning for Paget's disease.  Targeted ultrasound is performed, showing an irregular hypoechoic mass in the right breast at 10 o'clock 7 cm from the nipple measuring 1.0 x 1.3 x 1.1 cm. There are numerous  smaller hypoechoic nodules extending towards the nipple. There is a hypoechoic mass at 10 o'clock 4 cm from the nipple measuring 0.6 x 0.5 x 0.7 cm. There is a 5 x 5 x 6 mm hypoechoic nodule in the right breast at 10 o'clock 6 cm from the nipple.  Sonographic evaluation of the right axilla does not show any enlarged adenopathy.  IMPRESSION: Imaging and clinical findings are concerning for invasive ductal carcinoma, ductal carcinoma in-situ and Paget's disease of the right nipple.  RECOMMENDATION: Ultrasound-guided core biopsy of the mass in the right breast at 10 o'clock 7 cm from the nipple will be performed and dictated separately. If the biopsy results are positive for malignancy I would recommend punch biopsy of the nipple or if clinically indicated second biopsy of a more anterior nodule to prove the extent of disease.  I have discussed the findings and recommendations with the patient. Results were also provided in writing at the conclusion of the visit. If applicable, a reminder letter will be sent to the patient regarding  the next appointment.  BI-RADS CATEGORY  5: Highly suggestive of malignancy.   Electronically Signed   By: Lillia Mountain M.D.   On: 01/16/2015 15:42   Mm Digital Diagnostic Unilat R  01/17/2015   ADDENDUM REPORT: 01/17/2015 14:06  ADDENDUM: The patient returned today 01/17/2015 for results. However, the final pathology results are delayed until tomorrow due to the need for additional analysis. Susa Raring, nurse navigator at the [facility] attempted to call the patient earlier today in order to inform her of the delay, but she was unable to reach the patient. I relayed this information to the patient when she arrived here today.  The patient denied significant pain or bleeding at the biopsy site. The biopsy site is soft and dry, and there is no palpable hematoma.  The patient wishes to return tomorrow in order to receive her results in person. She has been  scheduled for 01/18/2015 at 2 o'clock p.m.   Electronically Signed   By: Evangeline Dakin M.D.   On: 01/17/2015 14:06   01/17/2015   CLINICAL DATA:  Status post ultrasound-guided core biopsy of the right breast.  EXAM: DIAGNOSTIC RIGHT MAMMOGRAM POST ULTRASOUND BIOPSY  COMPARISON:  None.  FINDINGS: Mammographic images were obtained following ultrasound guided biopsy of a mass in the 10 o'clock region the right breast. Mammographic images showed there is a ribbon shaped clip in the posterior third of the upper-outer quadrant of right breast associated with the suspicious mass.  IMPRESSION: Status post ultrasound-guided core biopsy of the right breast with pathology pending.  Final Assessment: Post Procedure Mammograms for Marker Placement  Electronically Signed: By: Lillia Mountain M.D. On: 01/16/2015 16:03   Mm Radiologist Eval And Mgmt  01/18/2015   CONSULTATION: HPI: The patient returns for a followup after ultrasound-guided biopsy of a mass in the upper-outer right breast at 10 o'clock. The patient is doing well and denies any biopsy site complications.  Pathology results: Pathology results from ultrasound-guided biopsy of the mass in the right breast at 10 o'clock revealed invasive and in situ breast carcinoma. This is concordant with the imaging findings.  Biopsy site: The patient's biopsy site was examined yesterday. Clean, dry, and intact. Steri-Strips overlie small skin incision.  PLAN: All the patient's questions were answered. She is aware of the results and demonstrates understanding and has received information material regarding her diagnosis. Depending on the results of the MRI, the patient may need stereotactic guided biopsy of calcifications in her right breast to determine extent of disease.  The patient is scheduled to be seen in multidisciplinary Clinic 01/30/2015 at 9:30 a.m. A breast MRI is scheduled for 01/28/2015 at 7 a.m.  She has been instructed to call the breast center with any questions  or concerns.   Electronically Signed   By: Everlean Alstrom M.D.   On: 01/18/2015 14:41   US Breast Ltd Uni Right Inc Axilla  01/16/2015   CLINICAL DATA:  81 year old female complaining of a palpable abnormality in the 10 o'clock region of the right breast 7 cm from the nipple, spontaneous bloody nipple discharge and an erythematous right nipple.  EXAM: DIGITAL DIAGNOSTIC BILATERAL MAMMOGRAM WITH CAD  ULTRASOUND RIGHT BREAST  COMPARISON:  None.  ACR Breast Density Category b: There are scattered areas of fibroglandular density.  FINDINGS: No suspicious mass or malignant type microcalcifications identified in the left breast.  In the upper-outer quadrant of the right breast there are coarse pleomorphic calcifications in a segmental pattern spanning  an area 9.6 cm. There is associated distortion and asymmetric density. In the upper-outer quadrant of the posterior third of the breast there is discrete spiculated mass measuring 1.9 x 1.8 x 1.3 cm. 4.6 cm anterior to this is a mass measuring 1.0 x 0.7 x 0.4 cm. There is visible thickening of the right nipple mammographically.  Mammographic images were processed with CAD.  On physical exam, I palpate a discrete mass in the right breast at 10 o'clock 7 cm from the nipple. There is erythema of the right nipple concerning for Paget's disease.  Targeted ultrasound is performed, showing an irregular hypoechoic mass in the right breast at 10 o'clock 7 cm from the nipple measuring 1.0 x 1.3 x 1.1 cm. There are numerous smaller hypoechoic nodules extending towards the nipple. There is a hypoechoic mass at 10 o'clock 4 cm from the nipple measuring 0.6 x 0.5 x 0.7 cm. There is a 5 x 5 x 6 mm hypoechoic nodule in the right breast at 10 o'clock 6 cm from the nipple.  Sonographic evaluation of the right axilla does not show any enlarged adenopathy.  IMPRESSION: Imaging and clinical findings are concerning for invasive ductal carcinoma, ductal carcinoma in-situ and Paget's disease  of the right nipple.  RECOMMENDATION: Ultrasound-guided core biopsy of the mass in the right breast at 10 o'clock 7 cm from the nipple will be performed and dictated separately. If the biopsy results are positive for malignancy I would recommend punch biopsy of the nipple or if clinically indicated second biopsy of a more anterior nodule to prove the extent of disease.  I have discussed the findings and recommendations with the patient. Results were also provided in writing at the conclusion of the visit. If applicable, a reminder letter will be sent to the patient regarding the next appointment.  BI-RADS CATEGORY  5: Highly suggestive of malignancy.   Electronically Signed   By: Lillia Mountain M.D.   On: 01/16/2015 15:42   [place] Rt Breast Bx W Loc Dev 1st Lesion Img Bx Spec US Guide  01/16/2015   CLINICAL DATA:  81 year old female complaining of a palpable mass in the 10 o'clock region of the right breast, spontaneous bloody nipple discharge in and erythematous right nipple. Sonographically a suspicious mass was seen in the right breast at 10 o'clock 7 cm from the nipple.  EXAM: ULTRASOUND GUIDED RIGHT BREAST CORE NEEDLE BIOPSY  COMPARISON:  Previous exam(s).  FINDINGS: I met with the patient and we discussed the procedure of ultrasound-guided biopsy, including benefits and alternatives. We discussed the high likelihood of a successful procedure. We discussed the risks of the procedure, including infection, bleeding, tissue injury, clip migration, and inadequate sampling. Informed written consent was given. The usual time-out protocol was performed immediately prior to the procedure.  Using sterile technique and 2% Lidocaine as local anesthetic, under direct ultrasound visualization, a 14 gauge spring-loaded device was used to perform biopsy of a mass in the 10 o'clock region the right breast 7 cm from the nipple using an inferior to superior approach. At the conclusion of the procedure a ribbon shaped tissue marker  clip was deployed into the biopsy cavity. Follow up 2 view mammogram was performed and dictated separately.  IMPRESSION: Ultrasound guided biopsy of the right breast. No apparent complications.   Electronically Signed   By: Lillia Mountain M.D.   On: 01/16/2015 15:45       IMPRESSION:    Breast cancer of upper-inner quadrant of right female breast  01/16/2015 Breast US irregular hypoechoic mass in the right breast at 10 o'clock 7 cm from the nipple measuring 1.0 x 1.3 x 1.1 cm. There are numerous smaller hypoechoic nodules extending towards the nipple.    01/16/2015 Breast US There is a hypoechoic mass at 10 o'clock 4 cm from the nipple measuring 0.6 x 0.5 x 0.7 cm. There is a 5 x 5 x 6 mm hypoechoic nodule in the right breast at 10 o'clock 6 cm from the nipple.   01/18/2015 Initial Biopsy Breast, right, needle core biopsy, mass, 10 o'clock - INVASIVE DUCTAL CARCINOMA, SEE COMMENT. - DUCTAL CARCINOMA IN SITU WITH NECROSIS   01/18/2015 Receptors her2 Estrogen Receptor: 100%, POSITIVE, STRONG STAINING INTENSITY Progesterone Receptor: 100%, POSITIVE, STRONG STAINING INTENSITY Proliferation Marker Ki67: 19%, Her2neu - negative   01/22/2015 Initial Diagnosis Breast cancer of upper-inner quadrant of right female breast   01/28/2015 Breast MRI Enhancement of the right nipple concerning for Paget's disease. 11.6 cm of abnormal enhancement in the upper-outer quadrant of the right breast consistent with invasive and ductal carcinoma in-situ.     The patient has a recent diagnosis of invasive ductal carcinoma of the rightst. based on the imaging findings and extent of the disease, the patient appears to represent a possible mastectomy candidate. She will discuss this further with surgery. She stated to me today that she certainly would like to proceed with a lumpectomy if feasible. This would require further workup given the extent of imaging findings seen if this is her final choice. Again she will discuss this further  with surgery.  I discussed with the patient the  potential role of adjuvant radiation treatment in this setting. We discussed the potential benefit of radiation treatment, especially with regards to local control of the patient's tumor. We also discussed the possible side effects and risks of such a treatment as well.   All of the patient's questions were answered. If the patient proceeds with lumpectomy ultimately, then it would be appropriate for me to see the patient back postoperatively to discuss possible adjuvant radiation treatment. The patient's fitness level is quite good and she indicated a preference today to treat her cancer aggressively. Depending on the final pathologic results in such a setting, she may be someone who also would have good local control without further treatment. We could discuss this further. If she ultimately proceeded with a mastectomy which appears to be a likely possibility, then possible postmastectomy radiation treatment would depend on her final pathologic findings. Given the discrete tumors in the setting of a broader area of change, she very well may have a lower stage then would require postmastectomy radiation treatment. However certainly requiring radiation is also a possibility depending on her final staging. I discussed all of this with the patient today in detail.   PLAN: I look forward to seeing the patient postoperatively as needed depending on her final clinical course and findings to further evaluate the patient and discuss radiation treatment as necessary.      ________________________________   Jodelle Gross, MD, PhD   **Disclaimer: This note was dictated with voice recognition software. Similar sounding words can inadvertently be transcribed and this note may contain transcription errors which may not have been corrected upon publication of note.**

## 2015-02-04 ENCOUNTER — Telehealth: Payer: Self-pay | Admitting: *Deleted

## 2015-02-04 ENCOUNTER — Encounter: Payer: Self-pay | Admitting: General Practice

## 2015-02-04 NOTE — Progress Notes (Signed)
Regina Cardenas with pt and dtr Regina Cardenas in [facility] to introduce [facility] team/resources, and to review distress screen per protocol.  The patient scored a 4 on the Psychosocial Distress Thermometer which indicates moderate distress. Also assessed for distress and other psychosocial needs.   ONCBCN DISTRESS SCREENING 02/04/2015  Screening Type Initial Screening  Distress experienced in past week (1-10) 4  Referral to support programs Yes  Other Spiritual Care, counseling interns   Ms Cullop notes that her distress fluctuates. She is using perspective, gratitude, and faith to cope.  Follow up needed: No.  Pt and family are aware of [facility] resources and have team contact info.  [place], [place]

## 2015-02-04 NOTE — Telephone Encounter (Signed)
Called patient from [facility] 01/30/15.  Memory was full and I was unable to leave message.  Will try again.

## 2015-02-05 ENCOUNTER — Ambulatory Visit
Admission: RE | Admit: 2015-02-05 | Discharge: 2015-02-05 | Disposition: A | Discharge status: Home / Self Care | Payer: Medicare Other | Source: Ambulatory Visit | Attending: General Surgery | Admitting: General Surgery

## 2015-02-05 ENCOUNTER — Telehealth: Payer: Self-pay | Admitting: *Deleted

## 2015-02-05 DIAGNOSIS — C50911 Malignant neoplasm of unspecified site of right female breast: Secondary | ICD-10-CM

## 2015-02-05 DIAGNOSIS — C50411 Malignant neoplasm of upper-outer quadrant of right female breast: Secondary | ICD-10-CM | POA: Diagnosis not present

## 2015-02-05 DIAGNOSIS — D0511 Intraductal carcinoma in situ of right breast: Secondary | ICD-10-CM | POA: Diagnosis not present

## 2015-02-05 NOTE — Telephone Encounter (Signed)
Left message with patient's daughter due to her phone not working.  Awaiting response.

## 2015-02-07 ENCOUNTER — Ambulatory Visit (HOSPITAL_COMMUNITY)
Admission: RE | Admit: 2015-02-07 | Discharge: 2015-02-07 | Disposition: A | Discharge status: Home / Self Care | Payer: Medicare Other | Source: Ambulatory Visit | Attending: Oncology | Admitting: Oncology

## 2015-02-07 DIAGNOSIS — R932 Abnormal findings on diagnostic imaging of liver and biliary tract: Secondary | ICD-10-CM | POA: Insufficient documentation

## 2015-02-07 DIAGNOSIS — C50919 Malignant neoplasm of unspecified site of unspecified female breast: Secondary | ICD-10-CM

## 2015-02-07 DIAGNOSIS — K769 Liver disease, unspecified: Secondary | ICD-10-CM | POA: Insufficient documentation

## 2015-02-07 DIAGNOSIS — K7689 Other specified diseases of liver: Secondary | ICD-10-CM | POA: Diagnosis not present

## 2015-02-08 ENCOUNTER — Telehealth: Payer: Self-pay | Admitting: Hematology & Oncology

## 2015-02-08 ENCOUNTER — Telehealth: Payer: Self-pay | Admitting: *Deleted

## 2015-02-08 DIAGNOSIS — H43813 Vitreous degeneration, bilateral: Secondary | ICD-10-CM | POA: Diagnosis not present

## 2015-02-08 DIAGNOSIS — H2513 Age-related nuclear cataract, bilateral: Secondary | ICD-10-CM | POA: Diagnosis not present

## 2015-02-08 DIAGNOSIS — H3532 Exudative age-related macular degeneration: Secondary | ICD-10-CM | POA: Diagnosis not present

## 2015-02-08 DIAGNOSIS — H04123 Dry eye syndrome of bilateral lacrimal glands: Secondary | ICD-10-CM | POA: Diagnosis not present

## 2015-02-08 NOTE — Telephone Encounter (Signed)
Son (he is a pt of ours) called wants Dr. Marin Olp to see his mom. Pt is aware of 3-10 appointment

## 2015-02-08 NOTE — Telephone Encounter (Signed)
Spoke with patient to give her results of her liver U/S.  Patient verbalized understanding.

## 2015-02-11 DIAGNOSIS — H3531 Nonexudative age-related macular degeneration: Secondary | ICD-10-CM | POA: Diagnosis not present

## 2015-02-11 DIAGNOSIS — H3532 Exudative age-related macular degeneration: Secondary | ICD-10-CM | POA: Diagnosis not present

## 2015-02-12 ENCOUNTER — Ambulatory Visit (HOSPITAL_COMMUNITY): Payer: Medicare Other

## 2015-02-13 ENCOUNTER — Other Ambulatory Visit (INDEPENDENT_AMBULATORY_CARE_PROVIDER_SITE_OTHER): Payer: Self-pay | Admitting: General Surgery

## 2015-02-13 DIAGNOSIS — Z7982 Long term (current) use of aspirin: Secondary | ICD-10-CM | POA: Diagnosis not present

## 2015-02-13 DIAGNOSIS — H3530 Unspecified macular degeneration: Secondary | ICD-10-CM | POA: Diagnosis not present

## 2015-02-13 DIAGNOSIS — C50011 Malignant neoplasm of nipple and areola, right female breast: Secondary | ICD-10-CM | POA: Diagnosis not present

## 2015-02-13 DIAGNOSIS — C50911 Malignant neoplasm of unspecified site of right female breast: Secondary | ICD-10-CM

## 2015-02-13 DIAGNOSIS — Z90722 Acquired absence of ovaries, bilateral: Secondary | ICD-10-CM | POA: Diagnosis not present

## 2015-02-13 DIAGNOSIS — Z9079 Acquired absence of other genital organ(s): Secondary | ICD-10-CM | POA: Diagnosis not present

## 2015-02-13 DIAGNOSIS — C50411 Malignant neoplasm of upper-outer quadrant of right female breast: Secondary | ICD-10-CM | POA: Diagnosis not present

## 2015-02-13 DIAGNOSIS — Z90710 Acquired absence of both cervix and uterus: Secondary | ICD-10-CM | POA: Diagnosis not present

## 2015-02-14 ENCOUNTER — Other Ambulatory Visit: Payer: Medicare Other | Admitting: Lab

## 2015-02-14 ENCOUNTER — Telehealth: Payer: Self-pay | Admitting: Hematology & Oncology

## 2015-02-14 ENCOUNTER — Ambulatory Visit (HOSPITAL_BASED_OUTPATIENT_CLINIC_OR_DEPARTMENT_OTHER): Payer: Medicare Other | Admitting: Hematology & Oncology

## 2015-02-14 ENCOUNTER — Encounter: Payer: Self-pay | Admitting: Hematology & Oncology

## 2015-02-14 VITALS — BP 147/80 | HR 85 | Temp 97.7°F | Resp 14 | Ht 62.0 in | Wt 166.0 lb

## 2015-02-14 DIAGNOSIS — C50211 Malignant neoplasm of upper-inner quadrant of right female breast: Secondary | ICD-10-CM

## 2015-02-14 DIAGNOSIS — C50411 Malignant neoplasm of upper-outer quadrant of right female breast: Secondary | ICD-10-CM

## 2015-02-14 DIAGNOSIS — Z803 Family history of malignant neoplasm of breast: Secondary | ICD-10-CM | POA: Diagnosis not present

## 2015-02-14 DIAGNOSIS — Z808 Family history of malignant neoplasm of other organs or systems: Secondary | ICD-10-CM | POA: Diagnosis not present

## 2015-02-14 DIAGNOSIS — C50919 Malignant neoplasm of unspecified site of unspecified female breast: Secondary | ICD-10-CM | POA: Diagnosis not present

## 2015-02-14 DIAGNOSIS — Z8041 Family history of malignant neoplasm of ovary: Secondary | ICD-10-CM | POA: Diagnosis not present

## 2015-02-14 DIAGNOSIS — Z801 Family history of malignant neoplasm of trachea, bronchus and lung: Secondary | ICD-10-CM | POA: Diagnosis not present

## 2015-02-14 DIAGNOSIS — Z800 Family history of malignant neoplasm of digestive organs: Secondary | ICD-10-CM | POA: Diagnosis not present

## 2015-02-14 NOTE — Progress Notes (Signed)
Hematology and Oncology Follow Up Visit  Regina Cardenas [id number] 1929-01-11 81 y.o. 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 As she is a mother of one of my patients. She was seen at the main [place] but would like to be seen out by [place] because it is much more convenient for her.  She is 81 years old. She has been in great health.  She noticed some bleeding from the right breast back in October. This seemed to be intermittent. It then became more of a problem for her. She subsequently underwent a mammogram. She was found to have a mass in the right breast measuring 9.6 cm. There was also a second mass measuring 1.9 cm.  She then underwent a biopsy. The pathology report ([id number]) showed an invasive ductal carcinoma. It was ER positive, PR positive, and HER-2 negative.  She then underwent additional radiographic studies. She did have an MRI done. This showed an 11.6 x 4.6 x 4.07 or mass in the right breast. There is some changes around the nipple concerning for Paget's disease. There is no obvious axillary lymph nodes. Of course, the radiologist mentioned that there was an indeterminate 1.4 cm lesion in the left hepatic lobe of the liver. As such, an ultrasound was then done. This showed that his lesion was a benign cyst. Of course, the radiologist recommended either an MRI or CT scan for better evaluation.  She had another biopsy done. This was on March 1. The pathology report ([id number]) showed invasive ductal carcinoma.  She has seen Dr. Dalbert Batman of surgery. It was felt that a mastectomy would be necessary. She just felt that she needed to be seen by [place] as she has known [place] for a long time.  She feels well. She has been in great shape.  Her son came down from [place]. I have take care  of him for probably close to 15 years because of a history of testicular cancer. He is cured and doing well.  She has had no process with fatigue or weakness. There's been no chest wall pain. His been no cough or shortness of breath. [name] had no change in bowel or bladder habits. [name] had no leg swelling. [name] had no rashes.  Overall, her performance status is ECOG 0.     Medications:  Current outpatient prescriptions:  .  Aflibercept (EYLEA IO), Inject into the eye. Every 4 weeks at eye MD, Disp: , Rfl:  .  Multiple Vitamins-Minerals (ICAPS MV) TABS, Take by mouth daily., Disp: , Rfl:  .  Multiple Vitamins-Minerals (PRESERVISION AREDS 2 PO), Take 2 tablets by mouth daily., Disp: , Rfl:  .  tobramycin-dexamethasone (TOBRADEX) ophthalmic solution, Place 1 drop into the right eye every 4 (four) hours while awake. Use before Eylea treatment, Disp: , Rfl:  .  aspirin 325 MG EC tablet, Take 325 mg by mouth daily., Disp: , Rfl:   Allergies: No Known Allergies  Past Medical History, Surgical history, Social history, and Family History were reviewed and updated.  Review of Systems: As above  Physical Exam:  height is 5' 2"  (1.575 m) and weight is 166 lb (75.297 kg). Her oral temperature is 97.7 F (36.5 C). Her blood pressure is 147/80 and her pulse is 85. Her respiration is 14.   Wt Readings from Last 3  Encounters:  02/14/15 166 lb (75.297 kg)  01/30/15 167 lb 9.6 oz (76.023 kg)     Well-developed and well-nourished elderly female in no obvious distress. Head and neck exam shows no ocular or oral lesions. There are no palpable cervical or supraclavicular lymph nodes. Lungs are clear. Cardiac exam regular rate and rhythm with no murmurs, rubs or bruits. Abdomen is soft. She has good bowel sounds. There is no fluid wave. There is no palpable liver or spleen tip. Breast exam shows blood breast with no masses, edema or erythema. There is no left axillary adenopathy. Right breast shows an  area of ecchymoses at about the 7:00 position. She has an bandage over the areola  from a another biopsy. There is no right axillary adenopathy. Extremities shows no clubbing, cyanosis or edema. Skin exam shows no rashes, ecchymoses or petechia, outside of that associated with the biopsy of the right breast. Neurological exam is nonfocal.  Lab Results  Component Value Date   WBC 7.1 01/30/2015   HGB 14.5 01/30/2015   HCT 45.1 01/30/2015   MCV 88.3 01/30/2015   PLT 247 01/30/2015     Chemistry      Component Value Date/Time   NA 142 01/30/2015 1233   K 4.4 01/30/2015 1233   CO2 25 01/30/2015 1233   BUN 12.6 01/30/2015 1233   CREATININE 0.8 01/30/2015 1233      Component Value Date/Time   CALCIUM 9.7 01/30/2015 1233   ALKPHOS 77 01/30/2015 1233   AST 19 01/30/2015 1233   ALT 12 01/30/2015 1233   BILITOT 0.59 01/30/2015 1233         Impression and Plan: Regina Cardenas is 81 year old white female with a locally advanced ductal carcinoma the right breast. I would have to say that is probably is going be at least stage IIB. It possibly could be stage III depending on the actual size of the invasive component. I suspect that some, if not most of the actual tumor mass is carcinoma in situ.  There's no obvious lymphadenopathy that they see on x-ray studies. However, this will be determined by her surgery and sentinel node biopsy.  I would like to think that all she will need would be antiestrogen therapy. I think tomorrow surly would be reasonable for her.  She is still in good shape. As such, if there is no lymph node involvement, we might want to consider doing an Oncotype assay to see if there would be any kind of benefit from systemic chemotherapy. We have to keep in mind the age of the patient. There certainly might not be a great benefit to systemic chemotherapy.  I spent about 60 minutes with she and her son. I went over all my recommendations. We will have to see when her  surgery will be. I left a message for Dr. Dalbert Batman.  I probably will get her back here in about 6 weeks at which point she should be well-healed from her surgery.  The role of radiation therapy might be a consideration is that if we find that she has a large tumor or that there is close margins or positive margins at the time of surgery.  Of great interest is the fact that she has a very strong family history of malignancy. She says 6 or 7 of her siblings have had cancer. I think because of this, it would definitely be worthwhile checking her for any type of genetic mutation that might predispose her. I talked to her about  this. She agrees. We will run the test today.   Volanda Napoleon, MD 3/10/20161:36 PM

## 2015-02-14 NOTE — Telephone Encounter (Signed)
Son will let pt know 4-15 appointment

## 2015-02-17 NOTE — H&P (Signed)
Regina Cardenas  Location: [facility] Patient #: [id number] DOB: 1929-04-18 Widowed / Language: Cleophus Molt / Race: White Female      History of Present Illness  Patient words: breast eval.  The patient is a 81 year old female who presents with breast cancer. She returns for further discussion regarding the extensive cancer in her right breast. Her son is from [place] is here with her today. I performed his cholecystectomy a few years ago. He is an Marine scientist. We reviewed her workup to date. Her nipple biopsy is positive for breast cancer. The biopsy of the 1.8 cm mass in the right breast posteriorly is positive for invasive cancer, receptor positive, HER-2 negative. A third biopsy in the anterior breast, upper outer quadrant 4 cm from the nipple shows invasive ductal carcinoma. Ultrasound shows numerous smaller nodules extending from the posterior mass up to the nipple with numerous satellite nodules. Right axilla looks normal on ultrasound. MRI shows enhancement of the nipple consistent with cancer and also shows an 11.6 cm area of enhancement in the upper outer quadrant which is very suspicious. I told her that she had fairly widespread disease extending from the posterior breast TO the nipple and that she was not a candidate for lumpectomy. She is accepting of this now. [name] had an ultrasound of her liver which shows a benign cyst.  Comorbidities include macular degeneration, daily aspirin use, questionable history of TIA with negative workup, history TAH and BSO. Family history reveals breast cancer in a maternal aunt, brain cancer and a maternal aunt. She lives alone in [place]. Her son states that he can come and stay with her for a few days if she has a mastectomy. She is now accepting of a right total mastectomy and right axillary sentinel node biopsy. I discussed the indications, details, techniques, and numerous risk of the surgery with her and her son. They  are aware of the risk of bleeding, infection, skin necrosis, seroma, shoulder disability, arm swelling, arm numbness, cardiac pulmonary and thromboembolic problems. They understand all these issues well. All their questions were answered. They agree with this plan. She asked if she should have a reduction on the other side for balance. I told her that might be contemplated in the future but I would not increase her surgical risk at this time. She is okay with that decision.   Allergies  No Known Drug Allergies03/08/2015  Medication History  Eylea (2MG/0.05ML Solution, Intraocular) Active. Multivitamins (Oral) Active. TobraDex (0.3-0.1% Ointment, Ophthalmic) Active. Medications Reconciled  Vitals   Weight: 167 lb Height: 62in Body Surface Area: 1.82 m Body Mass Index: 30.54 kg/m Temp.: 97.50F(Temporal)  Pulse: 79 (Regular)  BP: 138/80 (Sitting, Left Arm, Standard)    Physical Exam  General Note: Alert. Elderly but performance status seems good. Oriented. Cooperative. Good insight. Son is with her throughout the encounter   Head and Neck Note: No adenopathy or mass   Chest and Lung Exam Note: Clear to auscultation bilaterally   Breast Note: Right breast is pendulous. Right nipple wound is healing and I removed the sutures. Still bleeds a little bit due to the cancer. Upper-outer quadrant right breast is a little nodular. Some ecchymoses. Left breast exam in left nipple exam unremarkable. No palpable axillary adenopathy on either side.   Cardiovascular Note: Regular rate and rhythm. No murmur. No ectopy.   Abdomen Note: Soft. Pfannenstiel incision.   Neurologic Note: Alert and oriented 4. No gross motor or sensory deficits. Gait normal.  Assessment & Plan  PRIMARY CANCER OF UPPER OUTER QUADRANT OF RIGHT FEMALE BREAST (174.4  C50.411)  Current Plans:    Schedule for Surgery The biopsy of your right nipple is positive for  cancer The biopsy of your right breast anteriorly is positive for cancer The original biopsy of the right breast posteriorly is positive for cancer. You have a very extensive disease in your right breast and you are not a candidate for lumpectomy We have talked about treatment options, and you have decided to go ahead with right total mastectomy and right axillary sentinel node biopsy We have discussed the details, techniques, and numerous risk of this surgery with you and your son. We will proceed with the scheduling process as soon as possible. After surgery you will be referred back to Dr. Magrinat and Dr. Moody for their opinions  MACULAR DEGENERATION (362.50  H35.30) HISTORY OF TOTAL ABDOMINAL HYSTERECTOMY AND BILATERAL SALPINGO-OOPHORECTOMY (V88.01  Z90.710) ON ASPIRIN AT HOME (V58.66  Z79.82) CANCER OF NIPPLE OF RIGHT BREAST (174.0  C50.011)    Haywood M. Ingram, M.D., FACS [facility] General and Minimally invasive Surgery Breast and Colorectal Surgery Office:   [phone] Pager:   [phone]  

## 2015-02-18 ENCOUNTER — Telehealth: Payer: Self-pay | Admitting: *Deleted

## 2015-02-18 ENCOUNTER — Encounter (HOSPITAL_COMMUNITY): Payer: Self-pay | Admitting: *Deleted

## 2015-02-18 MED ORDER — CEFAZOLIN SODIUM-DEXTROSE 2-3 GM-% IV SOLR
2.0000 g | INTRAVENOUS | Status: AC
  Administered 2015-02-19: 2 g via INTRAVENOUS
  Filled 2015-02-18: qty 50

## 2015-02-18 MED ORDER — CHLORHEXIDINE GLUCONATE 4 % EX LIQD
1.0000 "application " | Freq: Once | CUTANEOUS | Status: DC
  Filled 2015-02-18: qty 15

## 2015-02-18 NOTE — Telephone Encounter (Signed)
Spoke to pt son in regards to f/u appts noted with Drs. Magrinat and Ennever. Pt will continue care with Dr. Marin Olp. Appt with Dr. Jana Hakim cancelled.

## 2015-02-18 NOTE — Progress Notes (Signed)
Pt denies SOB, chest pain, and being under the care of a cardiologist. Pt denies having a chest x ray and EKG within the last year. Pt denies having a stress test, echo, and cardiac cath. Pt made aware to stop taking Aspirin, vitamins and herbal medications. Do not take any NSAIDs ie: Ibuprofen, Advil, Naproxen or any medication containing Aspirin.

## 2015-02-19 ENCOUNTER — Other Ambulatory Visit: Payer: Self-pay

## 2015-02-19 ENCOUNTER — Ambulatory Visit (HOSPITAL_COMMUNITY): Payer: Medicare Other | Admitting: Anesthesiology

## 2015-02-19 ENCOUNTER — Encounter (HOSPITAL_COMMUNITY): Payer: Self-pay | Admitting: *Deleted

## 2015-02-19 ENCOUNTER — Observation Stay (HOSPITAL_COMMUNITY)
Admission: RE | Admit: 2015-02-19 | Discharge: 2015-02-21 | Disposition: A | Discharge status: Home / Self Care | Payer: Medicare Other | Source: Ambulatory Visit | Attending: General Surgery | Admitting: General Surgery

## 2015-02-19 ENCOUNTER — Encounter (HOSPITAL_COMMUNITY)
Admission: RE | Disposition: A | Discharge status: Home / Self Care | Payer: Self-pay | Source: Ambulatory Visit | Attending: General Surgery

## 2015-02-19 ENCOUNTER — Encounter (HOSPITAL_COMMUNITY)
Admission: RE | Admit: 2015-02-19 | Discharge: 2015-02-19 | Disposition: A | Discharge status: Home / Self Care | Payer: Medicare Other | Source: Ambulatory Visit | Attending: General Surgery | Admitting: General Surgery

## 2015-02-19 DIAGNOSIS — Z79899 Other long term (current) drug therapy: Secondary | ICD-10-CM | POA: Insufficient documentation

## 2015-02-19 DIAGNOSIS — C50911 Malignant neoplasm of unspecified site of right female breast: Principal | ICD-10-CM | POA: Diagnosis present

## 2015-02-19 DIAGNOSIS — C50211 Malignant neoplasm of upper-inner quadrant of right female breast: Secondary | ICD-10-CM | POA: Diagnosis present

## 2015-02-19 DIAGNOSIS — H3530 Unspecified macular degeneration: Secondary | ICD-10-CM | POA: Diagnosis not present

## 2015-02-19 DIAGNOSIS — Z7982 Long term (current) use of aspirin: Secondary | ICD-10-CM | POA: Insufficient documentation

## 2015-02-19 HISTORY — DX: Unspecified hearing loss, unspecified ear: H91.90

## 2015-02-19 HISTORY — DX: Pneumonia, unspecified organism: J18.9

## 2015-02-19 HISTORY — PX: SIMPLE MASTECTOMY WITH AXILLARY SENTINEL NODE BIOPSY: SHX6098

## 2015-02-19 LAB — CREATININE, SERUM
Creatinine, Ser: 0.74 mg/dL (ref 0.50–1.10)
GFR calc Af Amer: 89 mL/min — ABNORMAL LOW (ref >90)
GFR calc non Af Amer: 77 mL/min — ABNORMAL LOW (ref >90)

## 2015-02-19 LAB — COMPREHENSIVE METABOLIC PANEL
ALT: 17 U/L (ref 0–35)
AST: 29 U/L (ref 0–37)
Albumin: 4 g/dL (ref 3.5–5.2)
Alkaline Phosphatase: 70 U/L (ref 39–117)
Anion gap: 12 (ref 5–15)
BILIRUBIN TOTAL: 1.2 mg/dL (ref 0.3–1.2)
BUN: 10 mg/dL (ref 6–23)
CHLORIDE: 106 mmol/L (ref 96–112)
CO2: 23 mmol/L (ref 19–32)
CREATININE: 0.87 mg/dL (ref 0.50–1.10)
Calcium: 9.6 mg/dL (ref 8.4–10.5)
GFR calc Af Amer: 69 mL/min — ABNORMAL LOW (ref >90)
GFR, EST NON AFRICAN AMERICAN: 60 mL/min — AB (ref >90)
Glucose, Bld: 105 mg/dL — ABNORMAL HIGH (ref 70–99)
Potassium: 4 mmol/L (ref 3.5–5.1)
SODIUM: 141 mmol/L (ref 135–145)
Total Protein: 7 g/dL (ref 6.0–8.3)

## 2015-02-19 LAB — CBC WITH DIFFERENTIAL/PLATELET
BASOS ABS: 0 10*3/uL (ref 0.0–0.1)
BASOS PCT: 1 % (ref 0–1)
EOS PCT: 2 % (ref 0–5)
Eosinophils Absolute: 0.1 10*3/uL (ref 0.0–0.7)
HCT: 45.8 % (ref 36.0–46.0)
Hemoglobin: 15.2 g/dL — ABNORMAL HIGH (ref 12.0–15.0)
Lymphocytes Relative: 24 % (ref 12–46)
Lymphs Abs: 1.5 10*3/uL (ref 0.7–4.0)
MCH: 29.3 pg (ref 26.0–34.0)
MCHC: 33.2 g/dL (ref 30.0–36.0)
MCV: 88.4 fL (ref 78.0–100.0)
Monocytes Absolute: 0.4 10*3/uL (ref 0.1–1.0)
Monocytes Relative: 7 % (ref 3–12)
Neutro Abs: 4.2 10*3/uL (ref 1.7–7.7)
Neutrophils Relative %: 66 % (ref 43–77)
PLATELETS: 216 10*3/uL (ref 150–400)
RBC: 5.18 MIL/uL — AB (ref 3.87–5.11)
RDW: 13.3 % (ref 11.5–15.5)
WBC: 6.3 10*3/uL (ref 4.0–10.5)

## 2015-02-19 LAB — CBC
HCT: 42.1 % (ref 36.0–46.0)
HEMOGLOBIN: 13.8 g/dL (ref 12.0–15.0)
MCH: 29.2 pg (ref 26.0–34.0)
MCHC: 32.8 g/dL (ref 30.0–36.0)
MCV: 89.2 fL (ref 78.0–100.0)
Platelets: 192 10*3/uL (ref 150–400)
RBC: 4.72 MIL/uL (ref 3.87–5.11)
RDW: 13.3 % (ref 11.5–15.5)
WBC: 9.4 10*3/uL (ref 4.0–10.5)

## 2015-02-19 SURGERY — SIMPLE MASTECTOMY WITH AXILLARY SENTINEL NODE BIOPSY
Anesthesia: General | Site: Breast | Laterality: Right

## 2015-02-19 MED ORDER — LIDOCAINE HCL (CARDIAC) 20 MG/ML IV SOLN
INTRAVENOUS | Status: DC | PRN
  Administered 2015-02-19: 100 mg via INTRAVENOUS

## 2015-02-19 MED ORDER — HYDROCODONE-ACETAMINOPHEN 5-325 MG PO TABS
1.0000 | ORAL_TABLET | ORAL | Status: DC | PRN
  Administered 2015-02-19: 2 via ORAL
  Filled 2015-02-19: qty 2

## 2015-02-19 MED ORDER — 0.9 % SODIUM CHLORIDE (POUR BTL) OPTIME
TOPICAL | Status: DC | PRN
  Administered 2015-02-19 (×3): 1000 mL

## 2015-02-19 MED ORDER — SODIUM CHLORIDE 0.9 % IV SOLN
10.0000 mg | INTRAVENOUS | Status: DC | PRN
  Administered 2015-02-19: 10 ug/min via INTRAVENOUS

## 2015-02-19 MED ORDER — ONDANSETRON HCL 4 MG/2ML IJ SOLN
INTRAMUSCULAR | Status: DC | PRN
  Administered 2015-02-19: 4 mg via INTRAVENOUS

## 2015-02-19 MED ORDER — FENTANYL CITRATE 0.05 MG/ML IJ SOLN
INTRAMUSCULAR | Status: AC
  Filled 2015-02-19: qty 5

## 2015-02-19 MED ORDER — MIDAZOLAM HCL 2 MG/2ML IJ SOLN
0.5000 mg | Freq: Once | INTRAMUSCULAR | Status: AC
  Administered 2015-02-19: 0.5 mg via INTRAVENOUS

## 2015-02-19 MED ORDER — PROPOFOL 10 MG/ML IV BOLUS
INTRAVENOUS | Status: AC
  Filled 2015-02-19: qty 20

## 2015-02-19 MED ORDER — TOBRAMYCIN-DEXAMETHASONE 0.3-0.1 % OP SUSP
1.0000 [drp] | OPHTHALMIC | Status: DC
  Filled 2015-02-19: qty 2.5

## 2015-02-19 MED ORDER — ONDANSETRON HCL 4 MG PO TABS: 4.0000 mg | ORAL_TABLET | Freq: Four times a day (QID) | ORAL | Status: DC | PRN

## 2015-02-19 MED ORDER — SODIUM CHLORIDE 0.9 % IJ SOLN
INTRAMUSCULAR | Status: AC
  Filled 2015-02-19: qty 10

## 2015-02-19 MED ORDER — FENTANYL CITRATE 0.05 MG/ML IJ SOLN
50.0000 ug | Freq: Once | INTRAMUSCULAR | Status: AC
  Administered 2015-02-19: 50 ug via INTRAVENOUS

## 2015-02-19 MED ORDER — ARTIFICIAL TEARS OP OINT
TOPICAL_OINTMENT | OPHTHALMIC | Status: AC
  Filled 2015-02-19: qty 3.5

## 2015-02-19 MED ORDER — PROSIGHT PO TABS
1.0000 | ORAL_TABLET | Freq: Every day | ORAL | Status: DC
  Administered 2015-02-19 – 2015-02-21 (×3): 1 via ORAL
  Filled 2015-02-19 (×3): qty 1

## 2015-02-19 MED ORDER — FENTANYL CITRATE 0.05 MG/ML IJ SOLN
INTRAMUSCULAR | Status: DC | PRN
Start: 2015-02-19 — End: 2015-02-19
  Administered 2015-02-19 (×3): 25 ug via INTRAVENOUS
  Administered 2015-02-19: 50 ug via INTRAVENOUS
  Administered 2015-02-19 (×5): 25 ug via INTRAVENOUS
  Administered 2015-02-19: 150 ug via INTRAVENOUS
  Administered 2015-02-19: 100 ug via INTRAVENOUS

## 2015-02-19 MED ORDER — ONDANSETRON HCL 4 MG/2ML IJ SOLN
INTRAMUSCULAR | Status: AC
  Filled 2015-02-19: qty 2

## 2015-02-19 MED ORDER — POTASSIUM CHLORIDE IN NACL 20-0.9 MEQ/L-% IV SOLN
INTRAVENOUS | Status: DC
  Administered 2015-02-19 – 2015-02-21 (×3): via INTRAVENOUS
  Filled 2015-02-19 (×4): qty 1000

## 2015-02-19 MED ORDER — HYDROMORPHONE HCL 1 MG/ML IJ SOLN
0.5000 mg | INTRAMUSCULAR | Status: DC | PRN
  Administered 2015-02-19: 1 mg via INTRAVENOUS
  Filled 2015-02-19: qty 1

## 2015-02-19 MED ORDER — EPHEDRINE SULFATE 50 MG/ML IJ SOLN
INTRAMUSCULAR | Status: DC | PRN
  Administered 2015-02-19: 5 mg via INTRAVENOUS
  Administered 2015-02-19: 10 mg via INTRAVENOUS

## 2015-02-19 MED ORDER — PRESERVISION AREDS 2 PO CAPS: 2.0000 | ORAL_CAPSULE | Freq: Every day | ORAL | Status: DC

## 2015-02-19 MED ORDER — METOCLOPRAMIDE HCL 5 MG/ML IJ SOLN
INTRAMUSCULAR | Status: AC
  Filled 2015-02-19: qty 2

## 2015-02-19 MED ORDER — ENOXAPARIN SODIUM 40 MG/0.4ML ~~LOC~~ SOLN
40.0000 mg | SUBCUTANEOUS | Status: DC
  Administered 2015-02-20: 40 mg via SUBCUTANEOUS
  Filled 2015-02-19: qty 0.4

## 2015-02-19 MED ORDER — MEPERIDINE HCL 25 MG/ML IJ SOLN: 6.2500 mg | INTRAMUSCULAR | Status: DC | PRN

## 2015-02-19 MED ORDER — ONDANSETRON HCL 4 MG/2ML IJ SOLN: 4.0000 mg | Freq: Four times a day (QID) | INTRAMUSCULAR | Status: DC | PRN

## 2015-02-19 MED ORDER — DEXAMETHASONE SODIUM PHOSPHATE 4 MG/ML IJ SOLN
INTRAMUSCULAR | Status: AC
  Filled 2015-02-19: qty 1

## 2015-02-19 MED ORDER — BUPIVACAINE-EPINEPHRINE (PF) 0.5% -1:200000 IJ SOLN
INTRAMUSCULAR | Status: DC | PRN
  Administered 2015-02-19: 30 mL

## 2015-02-19 MED ORDER — CEFAZOLIN SODIUM-DEXTROSE 2-3 GM-% IV SOLR
2.0000 g | Freq: Three times a day (TID) | INTRAVENOUS | Status: AC
  Administered 2015-02-19: 2 g via INTRAVENOUS
  Filled 2015-02-19: qty 50

## 2015-02-19 MED ORDER — PROPOFOL 10 MG/ML IV BOLUS
INTRAVENOUS | Status: DC | PRN
  Administered 2015-02-19 (×2): 50 mg via INTRAVENOUS
  Administered 2015-02-19: 150 mg via INTRAVENOUS

## 2015-02-19 MED ORDER — MIDAZOLAM HCL 2 MG/2ML IJ SOLN
INTRAMUSCULAR | Status: AC
  Administered 2015-02-19: 0.5 mg via INTRAVENOUS
  Filled 2015-02-19: qty 2

## 2015-02-19 MED ORDER — FENTANYL CITRATE 0.05 MG/ML IJ SOLN
INTRAMUSCULAR | Status: AC
  Filled 2015-02-19: qty 2

## 2015-02-19 MED ORDER — MIDAZOLAM HCL 2 MG/2ML IJ SOLN
1.0000 mg | Freq: Once | INTRAMUSCULAR | Status: AC
  Administered 2015-02-19: 1 mg via INTRAVENOUS

## 2015-02-19 MED ORDER — FENTANYL CITRATE 0.05 MG/ML IJ SOLN
25.0000 ug | INTRAMUSCULAR | Status: DC | PRN
  Administered 2015-02-19 (×2): 50 ug via INTRAVENOUS

## 2015-02-19 MED ORDER — EPHEDRINE SULFATE 50 MG/ML IJ SOLN
INTRAMUSCULAR | Status: AC
  Filled 2015-02-19: qty 1

## 2015-02-19 MED ORDER — LIDOCAINE HCL (CARDIAC) 20 MG/ML IV SOLN
INTRAVENOUS | Status: AC
  Filled 2015-02-19: qty 15

## 2015-02-19 MED ORDER — DEXAMETHASONE SODIUM PHOSPHATE 4 MG/ML IJ SOLN
INTRAMUSCULAR | Status: DC | PRN
  Administered 2015-02-19: 4 mg via INTRAVENOUS

## 2015-02-19 MED ORDER — FENTANYL CITRATE 0.05 MG/ML IJ SOLN
INTRAMUSCULAR | Status: AC
  Administered 2015-02-19: 50 ug via INTRAVENOUS
  Filled 2015-02-19: qty 2

## 2015-02-19 MED ORDER — LACTATED RINGERS IV SOLN
INTRAVENOUS | Status: DC | PRN
  Administered 2015-02-19 (×2): via INTRAVENOUS

## 2015-02-19 MED ORDER — PROMETHAZINE HCL 25 MG/ML IJ SOLN: 6.2500 mg | INTRAMUSCULAR | Status: DC | PRN

## 2015-02-19 MED ORDER — METOCLOPRAMIDE HCL 5 MG/ML IJ SOLN
INTRAMUSCULAR | Status: DC | PRN
  Administered 2015-02-19: 10 mg via INTRAVENOUS

## 2015-02-19 MED ORDER — TECHNETIUM TC 99M SULFUR COLLOID FILTERED
1.0000 | Freq: Once | INTRAVENOUS | Status: AC | PRN
  Administered 2015-02-19: 1 via INTRADERMAL

## 2015-02-19 MED ORDER — SODIUM CHLORIDE 0.9 % IJ SOLN
INTRAMUSCULAR | Status: DC | PRN
  Administered 2015-02-19: 5 mL via INTRAMUSCULAR

## 2015-02-19 SURGICAL SUPPLY — 46 items
APPLIER CLIP 9.375 MED OPEN (MISCELLANEOUS) ×3
BINDER BREAST LRG (GAUZE/BANDAGES/DRESSINGS) IMPLANT
BINDER BREAST XLRG (GAUZE/BANDAGES/DRESSINGS) ×3 IMPLANT
BIOPATCH RED 1 DISK 7.0 (GAUZE/BANDAGES/DRESSINGS) ×4 IMPLANT
BIOPATCH RED 1IN DISK 7.0MM (GAUZE/BANDAGES/DRESSINGS) ×2
CANISTER SUCTION 2500CC (MISCELLANEOUS) ×3 IMPLANT
CHLORAPREP W/TINT 26ML (MISCELLANEOUS) ×3 IMPLANT
CLIP APPLIE 9.375 MED OPEN (MISCELLANEOUS) ×1 IMPLANT
CONT SPEC 4OZ CLIKSEAL STRL BL (MISCELLANEOUS) IMPLANT
COVER PROBE W GEL 5X96 (DRAPES) ×3 IMPLANT
COVER SURGICAL LIGHT HANDLE (MISCELLANEOUS) ×3 IMPLANT
DEVICE DISSECT PLASMABLAD 3.0S (MISCELLANEOUS) ×1 IMPLANT
DRAIN CHANNEL 19F RND (DRAIN) IMPLANT
DRAPE CHEST BREAST 15X10 FENES (DRAPES) ×3 IMPLANT
DRAPE UTILITY XL STRL (DRAPES) ×6 IMPLANT
DRSG PAD ABDOMINAL 8X10 ST (GAUZE/BANDAGES/DRESSINGS) ×3 IMPLANT
ELECT BLADE 4.0 EZ CLEAN MEGAD (MISCELLANEOUS) ×3
ELECT CAUTERY BLADE 6.4 (BLADE) ×3 IMPLANT
ELECT REM PT RETURN 9FT ADLT (ELECTROSURGICAL) ×6
ELECTRODE BLDE 4.0 EZ CLN MEGD (MISCELLANEOUS) ×1 IMPLANT
ELECTRODE REM PT RTRN 9FT ADLT (ELECTROSURGICAL) ×2 IMPLANT
EVACUATOR SILICONE 100CC (DRAIN) ×6 IMPLANT
GLOVE EUDERMIC 7 POWDERFREE (GLOVE) ×3 IMPLANT
GOWN STRL REUS W/ TWL LRG LVL3 (GOWN DISPOSABLE) ×2 IMPLANT
GOWN STRL REUS W/ TWL XL LVL3 (GOWN DISPOSABLE) ×1 IMPLANT
GOWN STRL REUS W/TWL LRG LVL3 (GOWN DISPOSABLE) ×4
GOWN STRL REUS W/TWL XL LVL3 (GOWN DISPOSABLE) ×2
KIT BASIN OR (CUSTOM PROCEDURE TRAY) ×3 IMPLANT
KIT ROOM TURNOVER OR (KITS) ×3 IMPLANT
LIQUID BAND (GAUZE/BANDAGES/DRESSINGS) ×6 IMPLANT
NEEDLE 18GX1X1/2 (RX/OR ONLY) (NEEDLE) IMPLANT
NEEDLE HYPO 25GX1X1/2 BEV (NEEDLE) IMPLANT
NS IRRIG 1000ML POUR BTL (IV SOLUTION) ×3 IMPLANT
PACK GENERAL/GYN (CUSTOM PROCEDURE TRAY) ×3 IMPLANT
PAD ARMBOARD 7.5X6 YLW CONV (MISCELLANEOUS) ×3 IMPLANT
PLASMABLADE 3.0S (MISCELLANEOUS) ×3
SPECIMEN JAR X LARGE (MISCELLANEOUS) ×3 IMPLANT
SPONGE GAUZE 4X4 12PLY STER LF (GAUZE/BANDAGES/DRESSINGS) ×3 IMPLANT
STAPLER VISISTAT 35W (STAPLE) ×3 IMPLANT
SUT ETHILON 3 0 FSL (SUTURE) ×3 IMPLANT
SUT MNCRL AB 4-0 PS2 18 (SUTURE) ×3 IMPLANT
SUT SILK 2 0 FS (SUTURE) ×3 IMPLANT
SUT VIC AB 3-0 SH 18 (SUTURE) ×3 IMPLANT
SYR CONTROL 10ML LL (SYRINGE) ×3 IMPLANT
TAPE CLOTH SURG 6X10 WHT LF (GAUZE/BANDAGES/DRESSINGS) ×3 IMPLANT
TOWEL OR 17X26 10 PK STRL BLUE (TOWEL DISPOSABLE) ×3 IMPLANT

## 2015-02-19 NOTE — Interval H&P Note (Signed)
History and Physical Interval Note:  02/19/2015 2:11 PM  Regina Cardenas  has presented today for surgery, with the diagnosis of right breast cancer  The various methods of treatment have been discussed with the patient and family. After consideration of risks, benefits and other options for treatment, the patient has consented to  Procedure(s): TOTAL  MASTECTOMY WITH  SENTINEL LYMPH  NODE BIOPSY (Right) as a surgical intervention .  The patient's history has been reviewed, patient examined, no change in status, stable for surgery.  I have reviewed the patient's chart and labs.  Questions were answered to the patient's satisfaction.     Adin Hector

## 2015-02-19 NOTE — Anesthesia Procedure Notes (Addendum)
Anesthesia Regional Block:  Pectoralis block  Pre-Anesthetic Checklist: ,, timeout performed, Correct Patient, Correct Site, Correct Laterality, Correct Procedure, Correct Position, site marked, Risks and benefits discussed,  Surgical consent,  Pre-op evaluation,  At surgeon's request and post-op pain management  Laterality: N/A  Prep: Maximum Sterile Barrier Precautions used and chloraprep       Needles:  Injection technique: Single-shot  Needle Type: Echogenic Stimulator Needle     Needle Length: 10cm 10 cm Needle Gauge: 21 and 21 G    Additional Needles:  Procedures: ultrasound guided (picture in chart) Pectoralis block Narrative:  Start time: 02/19/2015 2:39 PM End time: 02/19/2015 2:49 PM Injection made incrementally with aspirations every 5 mL.  Performed by: Personally  Anesthesiologist: Alexis Frock  Additional Notes: R PEC II block, 26ml .5%marcaine with epi, talked to patient throughout procedure, US guided, no complications noted   Procedure Name: LMA Insertion Date/Time: 02/19/2015 3:17 PM Performed by: Jacquiline Doe A Pre-anesthesia Checklist: Patient identified, Timeout performed, Emergency Drugs available, Suction available and Patient being monitored Patient Re-evaluated:Patient Re-evaluated prior to inductionOxygen Delivery Method: Circle system utilized Preoxygenation: Pre-oxygenation with 100% oxygen Intubation Type: IV induction Ventilation: Mask ventilation without difficulty LMA: LMA inserted LMA Size: 4.0 Tube type: Oral Number of attempts: 1 Placement Confirmation: ETT inserted through vocal cords under direct vision,  positive ETCO2 and breath sounds checked- equal and bilateral Tube secured with: Tape Dental Injury: Teeth and Oropharynx as per pre-operative assessment

## 2015-02-19 NOTE — Transfer of Care (Signed)
Immediate Anesthesia Transfer of Care Note  Patient: Regina Cardenas  Procedure(s) Performed: Procedure(s): TOTAL  MASTECTOMY WITH  SENTINEL LYMPH  NODE BIOPSY (Right)  Patient Location: PACU  Anesthesia Type:GA combined with regional for post-op pain  Level of Consciousness: awake, alert , oriented, patient cooperative and responds to stimulation  Airway & Oxygen Therapy: Patient Spontanous Breathing and Patient connected to nasal cannula oxygen  Post-op Assessment: Report given to RN, Post -op Vital signs reviewed and stable, Patient moving all extremities and Patient moving all extremities X 4  Post vital signs: Reviewed and stable  Last Vitals:  Filed Vitals:   02/19/15 1449  BP: 133/48  Pulse: 84  Temp:   Resp: 21    Complications: No apparent anesthesia complications

## 2015-02-19 NOTE — Anesthesia Preprocedure Evaluation (Signed)
Anesthesia Evaluation  Patient identified by MRN, date of birth, ID band Patient awake    Airway        Dental   Pulmonary          Cardiovascular     Neuro/Psych TIA WU in past negative    GI/Hepatic negative GI ROS, Neg liver ROS,   Endo/Other  negative endocrine ROS  Renal/GU negative Renal ROS     Musculoskeletal   Abdominal   Peds  Hematology   Anesthesia Other Findings   Reproductive/Obstetrics                             Anesthesia Physical Anesthesia Plan  ASA: II  Anesthesia Plan: General   Post-op Pain Management: MAC Combined w/ Regional for Post-op pain   Induction: Intravenous  Airway Management Planned: Oral ETT  Additional Equipment:   Intra-op Plan:   Post-operative Plan:   Informed Consent: I have reviewed the patients History and Physical, chart, labs and discussed the procedure including the risks, benefits and alternatives for the proposed anesthesia with the patient or authorized representative who has indicated his/her understanding and acceptance.     Plan Discussed with:   Anesthesia Plan Comments: (Check am labs)        Anesthesia Quick Evaluation

## 2015-02-19 NOTE — Progress Notes (Signed)
Time out for SN injection completed at 1418. Time out form Pectoralis two block completed at 1436

## 2015-02-19 NOTE — Progress Notes (Signed)
Received patient from PACU post right breast mastectomy. Patient is alert and oriented, not in any distress, VSS, dressing to right breast intact, draind intact and functioning well. Oriented to unit and staff. Will endorse appropriately.

## 2015-02-19 NOTE — Op Note (Signed)
Patient Name:           Regina Cardenas   Date of Surgery:        02/19/2015  Pre op Diagnosis:      Multifocal invasive cancer right breast , hormone receptor positive, HER-2 negative.  Post op Diagnosis:    Same  Procedure:                 Inject blue dye right breast, right total mastectomy, right axillary sentinel node biopsy  Surgeon:                     Edsel Petrin. Dalbert Batman, M.D., FACS  Assistant:                      OR staff  Operative Indications:   The patient is a 81 year old female who presents with breast cancer. She returns for further discussion regarding the extensive cancer in her right breast.   We reviewed her workup to date. Her nipple biopsy is positive for breast cancer. The biopsy of the 1.8 cm mass in the right breast posteriorly is positive for invasive cancer, receptor positive, HER-2 negative. A third biopsy in the anterior breast, upper outer quadrant 4 cm from the nipple shows invasive ductal carcinoma. Ultrasound shows numerous smaller nodules extending from the posterior mass up to the nipple with numerous satellite nodules. Right axilla looks normal on ultrasound. MRI shows enhancement of the nipple consistent with cancer and also shows an 11.6 cm area of enhancement in the upper outer quadrant which is very suspicious. I told her that she had fairly widespread disease extending from the posterior breast to the nipple and that she was not a candidate for lumpectomy. She is accepting of this now. [name] had an ultrasound of her liver which shows a benign cyst.   Family history reveals breast cancer in a maternal aunt, brain cancer and a maternal aunt.  She is now accepting of a right total mastectomy and right axillary sentinel node biopsy. I discussed the indications, details, techniques, and numerous risk of the surgery with her and her son. She is brought to the operating room electively  Operative Findings:       I found 2 sentinel lymph nodes that  were very hot and very blue. Small amount of x-ray tissue was sent as a third specimen. The breast is very large but the dissection was uneventful and the skin closure looked good.  Procedure in Detail:          The patient underwent injection of radionuclide into the right breast by the nuclear medicine technician in the holding area. She also underwent a pectoral block by the anesthesiologist.    She was taken to the operating room and underwent general anesthesia with an LMA device. Surgical timeout was performed. Intravenous antibiotics were given. Following alcohol prep I injected 5 mL of blue dye into the right breast, subareolar area. This was 2 mL of methylene blue mixed with 3 mL of saline, and the right breast was massaged for a few minutes.      The entire right breast, axilla, chest wall and neck were prepped and draped in a sterile fashion. Using a marking pin I marked a transverse elliptical incision. This was a fairly long incision since there was not going to be reconstruction. We took as much skin as seemed appropriate to avoid redundancy The transverse elliptical incision was made. Skin flaps  were raised  to the infraclavicular area, medially to the parasternal area, inferiorly to the anterior rectus sheath and laterally to the latissimus dorsi muscle, anterior border. The breast was dissected off of the pectoralis major and minor muscles with electrocautery. The lateral skin margin was marked with a silk suture. Using the neoprobe I identified 2 very hot and very blue sentinel nodes. These were not enlarged. They  were sent as separate specimens. The breast was then amputated and sent to the pathology lab with the appropriate history attached. Hemostasis was excellent energy with electrocautery and a few metal clips. The wound was irrigated with 2 L of saline. Hemostasis looked very good. Skin flaps looked good. Two 5 French Blake drains were placed, one up into the axillary area and one  across the skin flaps. These were brought out through separate stab incisions inferolaterally, connected to suction bulbs and sutured to the skin. The mastectomy incision was closed in 2 layers. Inner layer of interrupted 3-0 Vicryl. Skin closed with running subcuticular 4-0 Monocryl and Dermabond. Dry bandage and breast binder was placed after the Dermabond dried. The patient tolerated the procedure well was taken to PACU in stable condition. EBL 100 mL. Counts correct. Complications none.         Edsel Petrin. Dalbert Batman, M.D., FACS General and Minimally Invasive Surgery Breast and Colorectal Surgery  02/19/2015 5:10 PM

## 2015-02-20 ENCOUNTER — Encounter (HOSPITAL_COMMUNITY): Payer: Self-pay | Admitting: General Surgery

## 2015-02-20 DIAGNOSIS — C50911 Malignant neoplasm of unspecified site of right female breast: Secondary | ICD-10-CM | POA: Diagnosis not present

## 2015-02-20 DIAGNOSIS — Z79899 Other long term (current) drug therapy: Secondary | ICD-10-CM | POA: Diagnosis not present

## 2015-02-20 DIAGNOSIS — H3530 Unspecified macular degeneration: Secondary | ICD-10-CM | POA: Diagnosis not present

## 2015-02-20 DIAGNOSIS — Z7982 Long term (current) use of aspirin: Secondary | ICD-10-CM | POA: Diagnosis not present

## 2015-02-20 DIAGNOSIS — C771 Secondary and unspecified malignant neoplasm of intrathoracic lymph nodes: Secondary | ICD-10-CM | POA: Diagnosis not present

## 2015-02-20 LAB — BASIC METABOLIC PANEL
Anion gap: 4 — ABNORMAL LOW (ref 5–15)
BUN: 10 mg/dL (ref 6–23)
CALCIUM: 8.7 mg/dL (ref 8.4–10.5)
CO2: 29 mmol/L (ref 19–32)
CREATININE: 0.81 mg/dL (ref 0.50–1.10)
Chloride: 107 mmol/L (ref 96–112)
GFR, EST AFRICAN AMERICAN: 76 mL/min — AB (ref >90)
GFR, EST NON AFRICAN AMERICAN: 65 mL/min — AB (ref >90)
GLUCOSE: 100 mg/dL — AB (ref 70–99)
Potassium: 4.5 mmol/L (ref 3.5–5.1)
Sodium: 140 mmol/L (ref 135–145)

## 2015-02-20 LAB — CBC
HCT: 38.9 % (ref 36.0–46.0)
Hemoglobin: 12.6 g/dL (ref 12.0–15.0)
MCH: 29 pg (ref 26.0–34.0)
MCHC: 32.4 g/dL (ref 30.0–36.0)
MCV: 89.6 fL (ref 78.0–100.0)
PLATELETS: 216 10*3/uL (ref 150–400)
RBC: 4.34 MIL/uL (ref 3.87–5.11)
RDW: 13.4 % (ref 11.5–15.5)
WBC: 8.9 10*3/uL (ref 4.0–10.5)

## 2015-02-20 NOTE — Anesthesia Postprocedure Evaluation (Signed)
  Anesthesia Post-op Note  Patient: Regina Cardenas  Procedure(s) Performed: Procedure(s): TOTAL  MASTECTOMY WITH  SENTINEL LYMPH  NODE BIOPSY (Right)  Patient Location: PACU  Anesthesia Type:General  Level of Consciousness: awake  Airway and Oxygen Therapy: Patient Spontanous Breathing  Post-op Pain: mild  Post-op Assessment: Post-op Vital signs reviewed  Post-op Vital Signs: Reviewed  Last Vitals:  Filed Vitals:   02/20/15 1003  BP: 94/45  Pulse: 93  Temp: 36.7 C  Resp: 18    Complications: No apparent anesthesia complications

## 2015-02-20 NOTE — Progress Notes (Signed)
1 Day Post-Op  Subjective: 12 hours postop Stable and alert. With denies nausea. Minimal pain. Ambulated to bathroom and able to void without difficulty. Hasn't had anything to eat or drink yet  Objective: Vital signs in last 24 hours: Temp:  [97.7 F (36.5 C)-98.9 F (37.2 C)] 98.9 F (37.2 C) (03/16 0535) Pulse Rate:  [71-99] 84 (03/16 0535) Resp:  [10-35] 16 (03/16 0535) BP: (97-170)/(48-87) 97/48 mmHg (03/16 0535) SpO2:  [93 %-99 %] 95 % (03/16 0535) Weight:  [71.1 kg (156 lb 12 oz)-75.297 kg (166 lb)] 71.1 kg (156 lb 12 oz) (03/15 1854)    Intake/Output from previous day: 03/15 0701 - 03/16 0700 In: 1400 [I.V.:1400] Out: 225 [Drains:75; Blood:150] Intake/Output this shift: Total I/O In: -  Out: 75 [Drains:75]   EXAM: General appearance: Alert. No distress. Oriented. Mental status normal. Cooperative. Daughter in room. Resp: clear to auscultation bilaterally Breasts:  Right mastectomy skin flaps looked good. Viable. No retained fluid or hematoma. Both drains functioning normally. Somewhat bloody but output only  75 mL overnight.  Lab Results:  Results for orders placed or performed during the hospital encounter of 02/19/15 (from the past 24 hour(s))  CBC WITH DIFFERENTIAL     Status: Abnormal   Collection Time: 02/19/15  1:46 PM  Result Value Ref Range   WBC 6.3 4.0 - 10.5 K/uL   RBC 5.18 (H) 3.87 - 5.11 MIL/uL   Hemoglobin 15.2 (H) 12.0 - 15.0 g/dL   HCT 45.8 36.0 - 46.0 %   MCV 88.4 78.0 - 100.0 fL   MCH 29.3 26.0 - 34.0 pg   MCHC 33.2 30.0 - 36.0 g/dL   RDW 13.3 11.5 - 15.5 %   Platelets 216 150 - 400 K/uL   Neutrophils Relative % 66 43 - 77 %   Neutro Abs 4.2 1.7 - 7.7 K/uL   Lymphocytes Relative 24 12 - 46 %   Lymphs Abs 1.5 0.7 - 4.0 K/uL   Monocytes Relative 7 3 - 12 %   Monocytes Absolute 0.4 0.1 - 1.0 K/uL   Eosinophils Relative 2 0 - 5 %   Eosinophils Absolute 0.1 0.0 - 0.7 K/uL   Basophils Relative 1 0 - 1 %   Basophils Absolute 0.0 0.0 - 0.1  K/uL  Comprehensive metabolic panel     Status: Abnormal   Collection Time: 02/19/15  1:46 PM  Result Value Ref Range   Sodium 141 135 - 145 mmol/L   Potassium 4.0 3.5 - 5.1 mmol/L   Chloride 106 96 - 112 mmol/L   CO2 23 19 - 32 mmol/L   Glucose, Bld 105 (H) 70 - 99 mg/dL   BUN 10 6 - 23 mg/dL   Creatinine, Ser 0.87 0.50 - 1.10 mg/dL   Calcium 9.6 8.4 - 10.5 mg/dL   Total Protein 7.0 6.0 - 8.3 g/dL   Albumin 4.0 3.5 - 5.2 g/dL   AST 29 0 - 37 U/L   ALT 17 0 - 35 U/L   Alkaline Phosphatase 70 39 - 117 U/L   Total Bilirubin 1.2 0.3 - 1.2 mg/dL   GFR calc non Af Amer 60 (L) >90 mL/min   GFR calc Af Amer 69 (L) >90 mL/min   Anion gap 12 5 - 15  CBC     Status: None   Collection Time: 02/19/15  9:16 PM  Result Value Ref Range   WBC 9.4 4.0 - 10.5 K/uL   RBC 4.72 3.87 - 5.11 MIL/uL  Hemoglobin 13.8 12.0 - 15.0 g/dL   HCT 42.1 36.0 - 46.0 %   MCV 89.2 78.0 - 100.0 fL   MCH 29.2 26.0 - 34.0 pg   MCHC 32.8 30.0 - 36.0 g/dL   RDW 13.3 11.5 - 15.5 %   Platelets 192 150 - 400 K/uL  Creatinine, serum     Status: Abnormal   Collection Time: 02/19/15  9:16 PM  Result Value Ref Range   Creatinine, Ser 0.74 0.50 - 1.10 mg/dL   GFR calc non Af Amer 77 (L) >90 mL/min   GFR calc Af Amer 89 (L) >90 mL/min     Studies/Results: Nm Sentinel Node Inj-no Rpt (breast)  02/19/2015   CLINICAL DATA: cancer right breast   Sulfur colloid was injected intradermally by the nuclear medicine  technologist for breast cancer sentinel node localization.     . enoxaparin (LOVENOX) injection  40 mg Subcutaneous Q24H  . fentaNYL      . multivitamin  1 tablet Oral Daily  . tobramycin-dexamethasone  1 drop Right Eye Q4H while awake     Assessment/Plan: s/p Procedure(s): TOTAL  MASTECTOMY WITH  SENTINEL LYMPH  NODE BIOPSY  POD #1  (12 hours postop) Right total mastectomy with sentinel node biopsy Stable Begin diet Ambulating in hall Educate regarding wound and drain care Home tomorrow.  Lives  alone but children are in town.  Macular degeneration Hard of hearing   @PROBHOSP @  LOS: 1 day    Regina Cardenas M 02/20/2015  . .prob

## 2015-02-20 NOTE — Progress Notes (Signed)
JP teaching given to patient and family

## 2015-02-20 NOTE — Progress Notes (Signed)
UR completed 

## 2015-02-21 DIAGNOSIS — Z79899 Other long term (current) drug therapy: Secondary | ICD-10-CM | POA: Diagnosis not present

## 2015-02-21 DIAGNOSIS — C50911 Malignant neoplasm of unspecified site of right female breast: Secondary | ICD-10-CM | POA: Diagnosis not present

## 2015-02-21 DIAGNOSIS — Z7982 Long term (current) use of aspirin: Secondary | ICD-10-CM | POA: Diagnosis not present

## 2015-02-21 DIAGNOSIS — H3530 Unspecified macular degeneration: Secondary | ICD-10-CM | POA: Diagnosis not present

## 2015-02-21 MED ORDER — POLYETHYLENE GLYCOL 3350 17 G PO PACK
17.0000 g | PACK | Freq: Once | ORAL | Status: AC
  Administered 2015-02-21: 17 g via ORAL
  Filled 2015-02-21: qty 1

## 2015-02-21 MED ORDER — HYDROCODONE-ACETAMINOPHEN 5-325 MG PO TABS: 1.0000 | ORAL_TABLET | ORAL | Status: DC | PRN

## 2015-02-21 NOTE — Progress Notes (Signed)
Discharge home. Home discharge instruction given, no question verbalized. 

## 2015-02-21 NOTE — Discharge Summary (Signed)
Patient ID: Regina Cardenas [id number] 81 y.o. 08/06/1929  Admit date: 02/19/2015  Discharge date and time: 02/21/2015  Admitting Physician: Adin Hector  Discharge Physician: Adin Hector  Admission Diagnoses: right breast cancer  Discharge Diagnoses: Multifocal invasive cancer right breast, including nipple involvement Macular degeneration  Operations: Procedure(s): TOTAL  MASTECTOMY WITH  SENTINEL LYMPH  NODE BIOPSY  Admission Condition: good  Discharged Condition: good  Indication for Admission: The patient is a 81 year old female who presents with breast cancer. She was evaluated as an outpatient in the breast multidisciplinary clinic and a second evaluation in my office following further biopsies.  Her nipple biopsy is positive for breast cancer. The biopsy of the 1.8 cm mass in the right breast posteriorly is positive for invasive cancer, receptor positive, HER-2 negative. A third biopsy in the anterior breast, upper outer quadrant 4 cm from the nipple shows invasive ductal carcinoma. Ultrasound shows numerous smaller nodules extending from the posterior mass up to the nipple with numerous satellite nodules. Right axilla looks normal on ultrasound. MRI shows enhancement of the nipple consistent with cancer and also shows an 11.6 cm area of enhancement in the upper outer quadrant which is very suspicious. I told her that she had fairly widespread disease extending from the posterior breast to the nipple and that she was not a candidate for lumpectomy. She is accepting of this now. [name] had an ultrasound of her liver which shows a benign cyst.  Family history reveals breast cancer in a maternal aunt, brain cancer and a maternal aunt. She is now accepting of a right total mastectomy and right axillary sentinel node biopsy. I discussed the indications, details, techniques, and numerous risk of the surgery with her and her son. She is brought to the operating  room electively  Hospital Course: On the day of admission the patient was taken to the operating room and underwent a right total mastectomy and right axillary sentinel node biopsy. Her breast was fairly large but the surgery was uneventful. Final pathology is pending at this time. The surgery was completed at about 5:30 PM. On postop day 1 she was stable and alert. She had not ambulated or had any diet yet and so we chose to keep her in the hospital another day due to her age and the need to meet discharge criteria. By postop day 2 she had been tolerating a diet, had ambulated in the hall 3 times, pain was well controlled. She felt ready to go home.     On the day of discharge her right mastectomy wound looked good. Skin flaps were viable and flat. Small amount of ecchymoses. Drainage was low volume but both drains functioning and serosanguineous. No arm swelling. No arm numbness.    She was instructed in diet, activities, and drain care. Her daughter stayed with her throughout the hospital stay and numerous questions were answered on the day of discharge. She was given a prescription for Norco for pain. She was told to continue all of her usual medications, including her daily low-dose aspirin. She was asked to return to see me in the office in 7-8 days. She knows that we will call her pathology report to her when it is completed.    She lives in [place] and her son and daughter are in town and will stay with her for a few days.  Consults: None  Significant Diagnostic Studies: Surgical pathology, pending  Treatments: surgery: Right total mastectomy and right axillary sentinel node biopsy  Disposition: Home  Patient Instructions:    Medication List    TAKE these medications        aspirin 325 MG EC tablet  Take 325 mg by mouth daily.     EYLEA IO  Inject into the eye. Every 4 weeks at eye MD     HYDROcodone-acetaminophen 5-325 MG per tablet  Commonly known as:  NORCO/VICODIN  Take 1-2  tablets by mouth every 4 (four) hours as needed for moderate pain.     ICAPS MV Tabs  Take 1 tablet by mouth daily.     PRESERVISION AREDS 2 PO  Take 2 tablets by mouth daily.     tobramycin-dexamethasone ophthalmic solution  Commonly known as:  TOBRADEX  Place 1 drop into the right eye every 4 (four) hours while awake. Use before Eylea treatment        Activity: activity as tolerated, ambulation emphasize. Right shoulder range of motion emphasized. No driving. No lifting. Diet: low fat, low cholesterol diet Wound Care: as directed  Follow-up:  With Dr. Dalbert Batman in 1 week.  Signed: Edsel Petrin. Dalbert Batman, M.D., FACS General and minimally invasive surgery Breast and Colorectal Surgery  02/21/2015, 6:45 AM

## 2015-03-06 ENCOUNTER — Ambulatory Visit: Payer: Medicare Other | Admitting: Oncology

## 2015-03-07 ENCOUNTER — Encounter: Payer: Self-pay | Admitting: Hematology & Oncology

## 2015-03-07 ENCOUNTER — Ambulatory Visit (HOSPITAL_BASED_OUTPATIENT_CLINIC_OR_DEPARTMENT_OTHER): Payer: Medicare Other | Admitting: Hematology & Oncology

## 2015-03-07 ENCOUNTER — Other Ambulatory Visit (HOSPITAL_BASED_OUTPATIENT_CLINIC_OR_DEPARTMENT_OTHER): Payer: Medicare Other

## 2015-03-07 VITALS — BP 145/71 | HR 79 | Temp 98.0°F | Resp 14 | Ht 62.0 in | Wt 162.0 lb

## 2015-03-07 DIAGNOSIS — C50211 Malignant neoplasm of upper-inner quadrant of right female breast: Secondary | ICD-10-CM | POA: Diagnosis not present

## 2015-03-07 DIAGNOSIS — C50411 Malignant neoplasm of upper-outer quadrant of right female breast: Secondary | ICD-10-CM | POA: Diagnosis not present

## 2015-03-07 DIAGNOSIS — C773 Secondary and unspecified malignant neoplasm of axilla and upper limb lymph nodes: Secondary | ICD-10-CM | POA: Diagnosis not present

## 2015-03-07 DIAGNOSIS — C50219 Malignant neoplasm of upper-inner quadrant of unspecified female breast: Secondary | ICD-10-CM | POA: Diagnosis not present

## 2015-03-07 DIAGNOSIS — Z170 Estrogen receptor positive status [ER+]: Secondary | ICD-10-CM | POA: Diagnosis not present

## 2015-03-07 LAB — CBC WITH DIFFERENTIAL (CANCER CENTER ONLY)
BASO#: 0.1 10*3/uL (ref 0.0–0.2)
BASO%: 0.5 % (ref 0.0–2.0)
EOS%: 1.3 % (ref 0.0–7.0)
Eosinophils Absolute: 0.1 10*3/uL (ref 0.0–0.5)
HEMATOCRIT: 40.8 % (ref 34.8–46.6)
HGB: 13.7 g/dL (ref 11.6–15.9)
LYMPH#: 1.5 10*3/uL (ref 0.9–3.3)
LYMPH%: 16 % (ref 14.0–48.0)
MCH: 29.8 pg (ref 26.0–34.0)
MCHC: 33.6 g/dL (ref 32.0–36.0)
MCV: 89 fL (ref 81–101)
MONO#: 0.9 10*3/uL (ref 0.1–0.9)
MONO%: 10.1 % (ref 0.0–13.0)
NEUT%: 72.1 % (ref 39.6–80.0)
NEUTROS ABS: 6.6 10*3/uL — AB (ref 1.5–6.5)
PLATELETS: 229 10*3/uL (ref 145–400)
RBC: 4.59 10*6/uL (ref 3.70–5.32)
RDW: 13.1 % (ref 11.1–15.7)
WBC: 9.1 10*3/uL (ref 3.9–10.0)

## 2015-03-07 LAB — CMP (CANCER CENTER ONLY)
ALBUMIN: 3.4 g/dL (ref 3.3–5.5)
ALT(SGPT): 12 U/L (ref 10–47)
AST: 24 U/L (ref 11–38)
Alkaline Phosphatase: 61 U/L (ref 26–84)
BUN, Bld: 10 mg/dL (ref 7–22)
CHLORIDE: 100 meq/L (ref 98–108)
CO2: 28 mEq/L (ref 18–33)
Calcium: 8.9 mg/dL (ref 8.0–10.3)
Creat: 0.9 mg/dl (ref 0.6–1.2)
Glucose, Bld: 111 mg/dL (ref 73–118)
POTASSIUM: 3.8 meq/L (ref 3.3–4.7)
SODIUM: 141 meq/L (ref 128–145)
TOTAL PROTEIN: 6.4 g/dL (ref 6.4–8.1)
Total Bilirubin: 1.4 mg/dl (ref 0.20–1.60)

## 2015-03-07 MED ORDER — DEXAMETHASONE 4 MG PO TABS: ORAL_TABLET | ORAL | Status: DC

## 2015-03-07 NOTE — Patient Instructions (Signed)
Cyclophosphamide injection What is this medicine? CYCLOPHOSPHAMIDE (sye kloe FOSS fa mide) is a chemotherapy drug. It slows the growth of cancer cells. This medicine is used to treat many types of cancer like lymphoma, myeloma, leukemia, breast cancer, and ovarian cancer, to name a few. This medicine may be used for other purposes; ask your health care provider or pharmacist if you have questions. COMMON BRAND NAME(S): Cytoxan, Neosar What should I tell my health care provider before I take this medicine? They need to know if you have any of these conditions: -blood disorders -history of other chemotherapy -infection -kidney disease -liver disease -recent or ongoing radiation therapy -tumors in the bone marrow -an unusual or allergic reaction to cyclophosphamide, other chemotherapy, other medicines, foods, dyes, or preservatives -pregnant or trying to get pregnant -breast-feeding How should I use this medicine? This drug is usually given as an injection into a vein or muscle or by infusion into a vein. It is administered in a hospital or clinic by a specially trained health care professional. Talk to your pediatrician regarding the use of this medicine in children. Special care may be needed. Overdosage: If you think you have taken too much of this medicine contact a poison control center or emergency room at once. NOTE: This medicine is only for you. Do not share this medicine with others. What if I miss a dose? It is important not to miss your dose. Call your doctor or health care professional if you are unable to keep an appointment. What may interact with this medicine? This medicine may interact with the following medications: -amiodarone -amphotericin B -azathioprine -certain antiviral medicines for HIV or AIDS such as protease inhibitors (e.g., indinavir, ritonavir) and zidovudine -certain blood pressure medications such as benazepril, captopril, enalapril, fosinopril,  lisinopril, moexipril, monopril, perindopril, quinapril, ramipril, trandolapril -certain cancer medications such as anthracyclines (e.g., daunorubicin, doxorubicin), busulfan, cytarabine, paclitaxel, pentostatin, tamoxifen, trastuzumab -certain diuretics such as chlorothiazide, chlorthalidone, hydrochlorothiazide, indapamide, metolazone -certain medicines that treat or prevent blood clots like warfarin -certain muscle relaxants such as succinylcholine -cyclosporine -etanercept -indomethacin -medicines to increase blood counts like filgrastim, pegfilgrastim, sargramostim -medicines used as general anesthesia -metronidazole -natalizumab This list may not describe all possible interactions. Give your health care provider a list of all the medicines, herbs, non-prescription drugs, or dietary supplements you use. Also tell them if you smoke, drink alcohol, or use illegal drugs. Some items may interact with your medicine. What should I watch for while using this medicine? Visit your doctor for checks on your progress. This drug may make you feel generally unwell. This is not uncommon, as chemotherapy can affect healthy cells as well as cancer cells. Report any side effects. Continue your course of treatment even though you feel ill unless your doctor tells you to stop. Drink water or other fluids as directed. Urinate often, even at night. In some cases, you may be given additional medicines to help with side effects. Follow all directions for their use. Call your doctor or health care professional for advice if you get a fever, chills or sore throat, or other symptoms of a cold or flu. Do not treat yourself. This drug decreases your body's ability to fight infections. Try to avoid being around people who are sick. This medicine may increase your risk to bruise or bleed. Call your doctor or health care professional if you notice any unusual bleeding. Be careful brushing and flossing your teeth or using a  toothpick because you may get an infection or bleed   more easily. If you have any dental work done, tell your dentist you are receiving this medicine. You may get drowsy or dizzy. Do not drive, use machinery, or do anything that needs mental alertness until you know how this medicine affects you. Do not become pregnant while taking this medicine or for 1 year after stopping it. Women should inform their doctor if they wish to become pregnant or think they might be pregnant. Men should not father a child while taking this medicine and for 4 months after stopping it. There is a potential for serious side effects to an unborn child. Talk to your health care professional or pharmacist for more information. Do not breast-feed an infant while taking this medicine. This medicine may interfere with the ability to have a child. This medicine has caused ovarian failure in some women. This medicine has caused reduced sperm counts in some men. You should talk with your doctor or health care professional if you are concerned about your fertility. If you are going to have surgery, tell your doctor or health care professional that you have taken this medicine. What side effects may I notice from receiving this medicine? Side effects that you should report to your doctor or health care professional as soon as possible: -allergic reactions like skin rash, itching or hives, swelling of the face, lips, or tongue -low blood counts - this medicine may decrease the number of white blood cells, red blood cells and platelets. You may be at increased risk for infections and bleeding. -signs of infection - fever or chills, cough, sore throat, pain or difficulty passing urine -signs of decreased platelets or bleeding - bruising, pinpoint red spots on the skin, black, tarry stools, blood in the urine -signs of decreased red blood cells - unusually weak or tired, fainting spells, lightheadedness -breathing problems -dark  urine -dizziness -palpitations -swelling of the ankles, feet, hands -trouble passing urine or change in the amount of urine -weight gain -yellowing of the eyes or skin Side effects that usually do not require medical attention (report to your doctor or health care professional if they continue or are bothersome): -changes in nail or skin color -hair loss -missed menstrual periods -mouth sores -nausea, vomiting This list may not describe all possible side effects. Call your doctor for medical advice about side effects. You may report side effects to FDA at 1-800-FDA-1088. Where should I keep my medicine? This drug is given in a hospital or clinic and will not be stored at home. NOTE: This sheet is a summary. It may not cover all possible information. If you have questions about this medicine, talk to your doctor, pharmacist, or health care provider.  2015, Elsevier/Gold Standard. (2012-10-07 16:22:58) Docetaxel injection What is this medicine? DOCETAXEL (doe se TAX el) is a chemotherapy drug. It targets fast dividing cells, like cancer cells, and causes these cells to die. This medicine is used to treat many types of cancers like breast cancer, certain stomach cancers, head and neck cancer, lung cancer, and prostate cancer. This medicine may be used for other purposes; ask your health care provider or pharmacist if you have questions. COMMON BRAND NAME(S): Docefrez, Taxotere What should I tell my health care provider before I take this medicine? They need to know if you have any of these conditions: -infection (especially a virus infection such as chickenpox, cold sores, or herpes) -liver disease -low blood counts, like low white cell, platelet, or red cell counts -an unusual or allergic reaction to docetaxel,  polysorbate 80, other chemotherapy agents, other medicines, foods, dyes, or preservatives -pregnant or trying to get pregnant -breast-feeding How should I use this  medicine? This drug is given as an infusion into a vein. It is administered in a hospital or clinic by a specially trained health care professional. Talk to your pediatrician regarding the use of this medicine in children. Special care may be needed. Overdosage: If you think you have taken too much of this medicine contact a poison control center or emergency room at once. NOTE: This medicine is only for you. Do not share this medicine with others. What if I miss a dose? It is important not to miss your dose. Call your doctor or health care professional if you are unable to keep an appointment. What may interact with this medicine? -cyclosporine -erythromycin -ketoconazole -medicines to increase blood counts like filgrastim, pegfilgrastim, sargramostim -vaccines Talk to your doctor or health care professional before taking any of these medicines: -acetaminophen -aspirin -ibuprofen -ketoprofen -naproxen This list may not describe all possible interactions. Give your health care provider a list of all the medicines, herbs, non-prescription drugs, or dietary supplements you use. Also tell them if you smoke, drink alcohol, or use illegal drugs. Some items may interact with your medicine. What should I watch for while using this medicine? Your condition will be monitored carefully while you are receiving this medicine. You will need important blood work done while you are taking this medicine. This drug may make you feel generally unwell. This is not uncommon, as chemotherapy can affect healthy cells as well as cancer cells. Report any side effects. Continue your course of treatment even though you feel ill unless your doctor tells you to stop. In some cases, you may be given additional medicines to help with side effects. Follow all directions for their use. Call your doctor or health care professional for advice if you get a fever, chills or sore throat, or other symptoms of a cold or flu. Do  not treat yourself. This drug decreases your body's ability to fight infections. Try to avoid being around people who are sick. This medicine may increase your risk to bruise or bleed. Call your doctor or health care professional if you notice any unusual bleeding. Be careful brushing and flossing your teeth or using a toothpick because you may get an infection or bleed more easily. If you have any dental work done, tell your dentist you are receiving this medicine. Avoid taking products that contain aspirin, acetaminophen, ibuprofen, naproxen, or ketoprofen unless instructed by your doctor. These medicines may hide a fever. This medicine contains an alcohol in the product. You may get drowsy or dizzy. Do not drive, use machinery, or do anything that needs mental alertness until you know how this medicine affects you. Do not stand or sit up quickly, especially if you are an older patient. This reduces the risk of dizzy or fainting spells. Avoid alcoholic drinks Do not become pregnant while taking this medicine. Women should inform their doctor if they wish to become pregnant or think they might be pregnant. There is a potential for serious side effects to an unborn child. Talk to your health care professional or pharmacist for more information. Do not breast-feed an infant while taking this medicine. What side effects may I notice from receiving this medicine? Side effects that you should report to your doctor or health care professional as soon as possible: -allergic reactions like skin rash, itching or hives, swelling of the face,  lips, or tongue -low blood counts - This drug may decrease the number of white blood cells, red blood cells and platelets. You may be at increased risk for infections and bleeding. -signs of infection - fever or chills, cough, sore throat, pain or difficulty passing urine -signs of decreased platelets or bleeding - bruising, pinpoint red spots on the skin, black, tarry stools,  nosebleeds -signs of decreased red blood cells - unusually weak or tired, fainting spells, lightheadedness -breathing problems -fast or irregular heartbeat -low blood pressure -mouth sores -nausea and vomiting -pain, swelling, redness or irritation at the injection site -pain, tingling, numbness in the hands or feet -swelling of the ankle, feet, hands -weight gain Side effects that usually do not require medical attention (report to your prescriber or health care professional if they continue or are bothersome): -bone pain -complete hair loss including hair on your head, underarms, pubic hair, eyebrows, and eyelashes -diarrhea -excessive tearing -changes in the color of fingernails -loosening of the fingernails -nausea -muscle pain -red flush to skin -sweating -weak or tired This list may not describe all possible side effects. Call your doctor for medical advice about side effects. You may report side effects to FDA at 1-800-FDA-1088. Where should I keep my medicine? This drug is given in a hospital or clinic and will not be stored at home. NOTE: This sheet is a summary. It may not cover all possible information. If you have questions about this medicine, talk to your doctor, pharmacist, or health care provider.  2015, [employer]vier/Gold Standard. (2013-10-19 22:21:02)

## 2015-03-07 NOTE — Progress Notes (Signed)
Hematology and Oncology Follow Up Visit  Regina Cardenas [id number] 09-28-1929 81 y.o. 03/07/2015   Principle Diagnosis:   Stage IIIA (T3N1M0) invasive ductal carcinoma of the right breast-ER positive/HER-2 negative  Current Therapy:    Status post mastectomy     Interim History:  Regina Cardenas is back for follow-up. She did have her mastectomy. This was done on March 16. The pathology report ([id number]) showed an invasive ductal carcinoma that measured 10.3 cm. There was lymphovascular space invasion. The skin was positive for tumor. All margins were negative. There were 2 out of 14 positive lymph nodes. The tumor was poorly differentiated. As such, she is a stage IIIa ductal carcinoma.  Her tumor is ER positive, PR positive, and HER-2 negative.  She still has one of the drainage catheters in. She goes back to see Dr. Dalbert Batman next week area  She comes in with her daughter.  She got through surgery quite well.  She was healthy all along. [name] been in good health. As such, I think that she would be a candidate for adjuvant chemotherapy.  She is not had any problems with nausea or vomiting. [name] had no problems with bowels or bladder. [name] had no rashes. [name] had no leg swelling.  Overall, her performance status is ECOG 1.  Medications:  Current outpatient prescriptions:  .  Aflibercept (EYLEA IO), Inject into the eye. Every 4 weeks at eye MD, Disp: , Rfl:  .  aspirin 325 MG EC tablet, Take 325 mg by mouth daily., Disp: , Rfl:  .  Multiple Vitamins-Minerals (PRESERVISION AREDS 2 PO), Take 2 tablets by mouth daily., Disp: , Rfl:  .  tobramycin-dexamethasone (TOBRADEX) ophthalmic solution, Place 1 drop into the right eye every 4 (four) hours while awake. TAKES ONLY 3 DAYS A MONTH-- 2 DAYS BEFORE AND DAY OF EYE INJECTION, Disp: , Rfl:  .  HYDROcodone-acetaminophen (NORCO/VICODIN) 5-325 MG per tablet, Take 1-2 tablets by mouth every 4 (four) hours as needed for moderate pain.  (Patient not taking: Reported on 03/07/2015), Disp: 30 tablet, Rfl: 0  Allergies: No Known Allergies  Past Medical History, Surgical history, Social history, and Family History were reviewed and updated.  Review of Systems: As above  Physical Exam:  height is _0  (1.575 m) and weight is 162 lb (73.483 kg). Her oral temperature is 98 F (36.7 C). Her blood pressure is 145/71 and her pulse is 79. Her respiration is 14.   Wt Readings from Last 3 Encounters:  03/07/15 162 lb (73.483 kg)  02/19/15 156 lb 12 oz (71.1 kg)  02/14/15 166 lb (75.297 kg)     [name] in no obvious distress. Head and neck exam shows no ocular or oral lesions. She has no palpable cervical or supraclavicular lymph nodes. Thyroid is none palpable. Lungs are clear bilaterally. Cardiac exam regular rate and rhythm with no murmurs, rubs or bruits. Abdomen is soft. She has good bowel sounds. There is no fluid wave. There is no palpable hepatosplenomegaly. Breast exam shows left breast with no masses, edema or erythema. There is no left axillary adenopathy. Right chest wall shows healing mastectomy. She has a dressing on the right chest wall. No obvious erythema is noted. A drainage catheter is intact. There is no obvious right axillary adenopathy. Extremities shows no clubbing, cyanosis or edema. Skin exam shows no rashes, ecchymoses or petechia.  Lab Results  Component Value Date   WBC 9.1 03/07/2015   HGB 13.7 03/07/2015  HCT 40.8 03/07/2015   MCV 89 03/07/2015   PLT 229 03/07/2015     Chemistry      Component Value Date/Time   NA 141 03/07/2015 1217   NA 140 02/20/2015 0625   NA 142 01/30/2015 1233   K 3.8 03/07/2015 1217   K 4.5 02/20/2015 0625   K 4.4 01/30/2015 1233   CL 100 03/07/2015 1217   CL 107 02/20/2015 0625   CO2 28 03/07/2015 1217   CO2 29 02/20/2015 0625   CO2 25 01/30/2015 1233   BUN 10 03/07/2015 1217   BUN 10 02/20/2015 0625   BUN 12.6 01/30/2015 1233    CREATININE 0.9 03/07/2015 1217   CREATININE 0.81 02/20/2015 0625   CREATININE 0.8 01/30/2015 1233      Component Value Date/Time   CALCIUM 8.9 03/07/2015 1217   CALCIUM 8.7 02/20/2015 0625   CALCIUM 9.7 01/30/2015 1233   ALKPHOS 61 03/07/2015 1217   ALKPHOS 70 02/19/2015 1346   ALKPHOS 77 01/30/2015 1233   AST 24 03/07/2015 1217   AST 29 02/19/2015 1346   AST 19 01/30/2015 1233   ALT 12 03/07/2015 1217   ALT 17 02/19/2015 1346   ALT 12 01/30/2015 1233   BILITOT 1.40 03/07/2015 1217   BILITOT 1.2 02/19/2015 1346   BILITOT 0.59 01/30/2015 1233         Impression and Plan: Regina Cardenas is 81 year old postmenopausal white female. She has stage IIIa invasive ductal carcinoma of the right breast. She had a very large tumor. She had 2 lymph nodes that were positive. She had lymphovascular space invasion.  Despite her age, I think that there would be a role for systemic chemotherapy in this situation. Again, I realize that she is quite mature but has a was been in good health and really has no comorbid health factors.  I spoke to she and her daughter for about an hour. I think that without adjuvant chemotherapy, the risk of recurrence is going to be significant. I think that with adjuvant chemotherapy, we can cut the risk down to less than 25%.  I really think that the size of the tumor is quite significant along with the lymphovascular space invasion and the 2 positive lymph nodes.  I think she would be a good candidate for systemic chemotherapy with Taxotere/Cytoxan. I 4 cycles would be appropriate. After this, I would consider radiation therapy for local control given that her tumor was so large. I would also consider her for adjuvant Femara.  I do think that she would be a good candidate for adjuvant Xgeva. Some recent studies have shown that adjuvant Xgeva twice a year does decrease risk of bone metastases and also improves survival.  I went over the side effects of treatment  with she and her daughter. She will need Neulasta. She will lose her hair. I will adjust the dose of chemotherapy a little bit because of her age.  I don't think she needs to start treatment for another 3 weeks area and she still needs to heal up a low bit from her surgery.  I don't think that she will need a Port-A-Cath. She has very good peripheral access.  I answered all their questions. I talked to her son who lives in [place]. I actually treated him over 10 years ago for testicular cancer.  I want to see her back myself for the start of cycle 2 of treatment in May.   Volanda Napoleon, MD 3/31/20165:51 PM

## 2015-03-08 ENCOUNTER — Telehealth: Payer: Self-pay | Admitting: Hematology & Oncology

## 2015-03-08 LAB — VITAMIN D 25 HYDROXY (VIT D DEFICIENCY, FRACTURES): Vit D, 25-Hydroxy: 30 ng/mL (ref 30–100)

## 2015-03-08 NOTE — Telephone Encounter (Signed)
Pt aware of 4-20 and 5-11 appointments

## 2015-03-12 DIAGNOSIS — H3531 Nonexudative age-related macular degeneration: Secondary | ICD-10-CM | POA: Diagnosis not present

## 2015-03-12 DIAGNOSIS — H3532 Exudative age-related macular degeneration: Secondary | ICD-10-CM | POA: Diagnosis not present

## 2015-03-13 DIAGNOSIS — H3532 Exudative age-related macular degeneration: Secondary | ICD-10-CM | POA: Diagnosis not present

## 2015-03-18 ENCOUNTER — Encounter: Payer: Self-pay | Admitting: Radiation Oncology

## 2015-03-20 ENCOUNTER — Ambulatory Visit
Admission: RE | Admit: 2015-03-20 | Discharge: 2015-03-20 | Disposition: A | Discharge status: Home / Self Care | Payer: Medicare Other | Source: Ambulatory Visit | Attending: Radiation Oncology | Admitting: Radiation Oncology

## 2015-03-20 ENCOUNTER — Ambulatory Visit: Payer: Medicare Other | Admitting: Physical Therapy

## 2015-03-20 ENCOUNTER — Encounter: Payer: Self-pay | Admitting: Radiation Oncology

## 2015-03-20 VITALS — BP 147/86 | HR 93 | Temp 98.0°F | Resp 16 | Wt 160.6 lb

## 2015-03-20 DIAGNOSIS — C50211 Malignant neoplasm of upper-inner quadrant of right female breast: Secondary | ICD-10-CM | POA: Diagnosis not present

## 2015-03-20 DIAGNOSIS — D0591 Unspecified type of carcinoma in situ of right breast: Secondary | ICD-10-CM

## 2015-03-20 HISTORY — DX: Malignant neoplasm of unspecified site of unspecified female breast: C50.919

## 2015-03-20 NOTE — Progress Notes (Signed)
Radiation Oncology         [phone] ________________________________  Name: Regina Cardenas MRN: [id number]  Date: 03/20/2015  DOB: [birth year]  Follow-Up Visit Note  CC: No PCP Per Patient  Fanny Skates, MD  Diagnosis:   Breast cancer of upper-inner quadrant of right female breast  Narrative:  The patient returns today for routine follow-up.  Past/Anticipated interventions by medical oncology, if any: Chemotherapy : Seen multidisciplinary Breast Clinic  01/30/15, follow up 03/07/15, recommends taxotere/Cytoxan, 4 cycles,  Dr. Marin Olp, next 04/17/15                              Had pain 4 days after surgery, which has since resolved.  The patient underwent a mastectomy since she was last seen. This returned positive for a large invasive ductal carcinoma. Her final stage was T3 N1 aM0. 2 lymph nodes were positive for metastasis.  ALLERGIES:  has No Known Allergies.  Meds: Current Outpatient Prescriptions  Medication Sig Dispense Refill  . Aflibercept (EYLEA IO) Inject into the eye. Every 4 weeks at eye MD    . aspirin 325 MG EC tablet Take 325 mg by mouth 2 (two) times daily.     . Multiple Vitamin (MULTIVITAMIN) tablet Take 1 tablet by mouth daily.    . Multiple Vitamins-Minerals (PRESERVISION AREDS 2 PO) Take 2 tablets by mouth daily.    Marland Kitchen tobramycin-dexamethasone (TOBRADEX) ophthalmic solution Place 1 drop into the right eye every 4 (four) hours while awake. TAKES ONLY 3 DAYS A MONTH-- 2 DAYS BEFORE AND DAY OF EYE INJECTION    . dexamethasone (DECADRON) 4 MG tablet Take 2 pills, with food, twice a day for 5 days starting day BEFORE each chemotherapy. (Patient not taking: Reported on 03/20/2015) 80 tablet 0  . HYDROcodone-acetaminophen (NORCO/VICODIN) 5-325 MG per tablet Take 1-2 tablets by mouth every 4 (four) hours as needed for moderate pain. (Patient not taking: Reported on 03/07/2015) 30 tablet 0   No current facility-administered medications for this encounter.     Physical Findings: The patient is in no acute distress. Patient is alert and oriented.  weight is 160 lb 9.6 oz (72.848 kg). Her oral temperature is 98 F (36.7 C). Her blood pressure is 147/86 and her pulse is 93. Her respiration is 16 and oxygen saturation is 100%. .   The surgical site is healing satisfactorily. No sign of infection.  Lab Findings: Lab Results  Component Value Date   WBC 9.1 03/07/2015   HGB 13.7 03/07/2015   HCT 40.8 03/07/2015   MCV 89 03/07/2015   PLT 229 03/07/2015     Radiographic Findings: Nm Sentinel Node Inj-no Rpt (breast)  02/19/2015   CLINICAL DATA: cancer right breast   Sulfur colloid was injected intradermally by the nuclear medicine  technologist for breast cancer sentinel node localization.     Impression:    Patient is T3 N1 M0 right sided breast cancer. She is a good candidate for adjuvant chemotherapy and radiation therapy.  Plan:  Start chemo and then will see patient towards the end of her course of chemotherapy to further discuss and coordinate an anticipated course of adjuvant radiation treatment.  I spent 30 minutes with the patient today, the majority of which was spent counseling the patient on the diagnosis of cancer and coordinating care.  This document serves as a record of services personally performed by Kyung Rudd, MD. It was created on his behalf  by Pearlie Oyster, a trained medical scribe. The creation of this record is based on the scribe's personal observations and the provider's statements to them. This document has been checked and approved by the attending provider.   Jodelle Gross, M.D., Ph.D.

## 2015-03-20 NOTE — Progress Notes (Signed)
See progress note physician encounter.

## 2015-03-20 NOTE — Progress Notes (Signed)
Vitals stable. Denies pain. No lymphedema of left arm noted. Full ROM of both upper extremities demonstrated. Reports mastectomy incision has healed well without redness, fever, edema or pain. Denies fatigue. Plans to start chemotherapy on 4/20 with Dr. Margurite Auerbach.

## 2015-03-22 ENCOUNTER — Other Ambulatory Visit: Payer: Medicare Other | Admitting: Lab

## 2015-03-22 ENCOUNTER — Ambulatory Visit: Payer: Medicare Other | Admitting: Hematology & Oncology

## 2015-03-27 ENCOUNTER — Ambulatory Visit (HOSPITAL_BASED_OUTPATIENT_CLINIC_OR_DEPARTMENT_OTHER): Payer: Medicare Other

## 2015-03-27 ENCOUNTER — Encounter: Payer: Self-pay | Admitting: *Deleted

## 2015-03-27 ENCOUNTER — Other Ambulatory Visit (HOSPITAL_BASED_OUTPATIENT_CLINIC_OR_DEPARTMENT_OTHER): Payer: Medicare Other

## 2015-03-27 ENCOUNTER — Encounter: Payer: Self-pay | Admitting: Hematology & Oncology

## 2015-03-27 VITALS — BP 133/69 | HR 82 | Temp 98.4°F

## 2015-03-27 DIAGNOSIS — C50911 Malignant neoplasm of unspecified site of right female breast: Secondary | ICD-10-CM

## 2015-03-27 DIAGNOSIS — C50211 Malignant neoplasm of upper-inner quadrant of right female breast: Secondary | ICD-10-CM

## 2015-03-27 DIAGNOSIS — C50411 Malignant neoplasm of upper-outer quadrant of right female breast: Secondary | ICD-10-CM

## 2015-03-27 DIAGNOSIS — Z5111 Encounter for antineoplastic chemotherapy: Secondary | ICD-10-CM | POA: Diagnosis present

## 2015-03-27 LAB — CMP (CANCER CENTER ONLY)
ALT: 17 U/L (ref 10–47)
AST: 26 U/L (ref 11–38)
Albumin: 3.6 g/dL (ref 3.3–5.5)
Alkaline Phosphatase: 72 U/L (ref 26–84)
BILIRUBIN TOTAL: 1 mg/dL (ref 0.20–1.60)
BUN, Bld: 12 mg/dL (ref 7–22)
CO2: 30 meq/L (ref 18–33)
CREATININE: 0.8 mg/dL (ref 0.6–1.2)
Calcium: 10.1 mg/dL (ref 8.0–10.3)
Chloride: 101 mEq/L (ref 98–108)
Glucose, Bld: 124 mg/dL — ABNORMAL HIGH (ref 73–118)
Potassium: 4 mEq/L (ref 3.3–4.7)
Sodium: 143 mEq/L (ref 128–145)
Total Protein: 7.1 g/dL (ref 6.4–8.1)

## 2015-03-27 LAB — CBC WITH DIFFERENTIAL (CANCER CENTER ONLY)
BASO#: 0 10*3/uL (ref 0.0–0.2)
BASO%: 0.3 % (ref 0.0–2.0)
EOS ABS: 0 10*3/uL (ref 0.0–0.5)
EOS%: 0.1 % (ref 0.0–7.0)
HEMATOCRIT: 42.2 % (ref 34.8–46.6)
HEMOGLOBIN: 13.9 g/dL (ref 11.6–15.9)
LYMPH#: 1 10*3/uL (ref 0.9–3.3)
LYMPH%: 8.7 % — ABNORMAL LOW (ref 14.0–48.0)
MCH: 29.3 pg (ref 26.0–34.0)
MCHC: 32.9 g/dL (ref 32.0–36.0)
MCV: 89 fL (ref 81–101)
MONO#: 0.5 10*3/uL (ref 0.1–0.9)
MONO%: 4.2 % (ref 0.0–13.0)
NEUT#: 10 10*3/uL — ABNORMAL HIGH (ref 1.5–6.5)
NEUT%: 86.7 % — ABNORMAL HIGH (ref 39.6–80.0)
PLATELETS: 349 10*3/uL (ref 145–400)
RBC: 4.75 10*6/uL (ref 3.70–5.32)
RDW: 13.3 % (ref 11.1–15.7)
WBC: 11.5 10*3/uL — AB (ref 3.9–10.0)

## 2015-03-27 MED ORDER — PROCHLORPERAZINE MALEATE 10 MG PO TABS: 10.0000 mg | ORAL_TABLET | Freq: Four times a day (QID) | ORAL | Status: DC | PRN

## 2015-03-27 MED ORDER — SODIUM CHLORIDE 0.9 % IV SOLN
Freq: Once | INTRAVENOUS | Status: AC
  Administered 2015-03-27: 11:00:00 via INTRAVENOUS
  Filled 2015-03-27: qty 8

## 2015-03-27 MED ORDER — SODIUM CHLORIDE 0.9 % IV SOLN
Freq: Once | INTRAVENOUS | Status: AC
  Administered 2015-03-27: 11:00:00 via INTRAVENOUS

## 2015-03-27 MED ORDER — ONDANSETRON HCL 8 MG PO TABS: 8.0000 mg | ORAL_TABLET | Freq: Two times a day (BID) | ORAL | Status: DC

## 2015-03-27 MED ORDER — SODIUM CHLORIDE 0.9 % IV SOLN
480.0000 mg/m2 | Freq: Once | INTRAVENOUS | Status: AC
  Administered 2015-03-27: 860 mg via INTRAVENOUS
  Filled 2015-03-27: qty 43

## 2015-03-27 MED ORDER — DOCETAXEL CHEMO INJECTION 160 MG/16ML
60.0000 mg/m2 | Freq: Once | INTRAVENOUS | Status: AC
  Administered 2015-03-27: 110 mg via INTRAVENOUS
  Filled 2015-03-27: qty 11

## 2015-03-27 MED ORDER — PEGFILGRASTIM 6 MG/0.6ML ~~LOC~~ PSKT
6.0000 mg | PREFILLED_SYRINGE | Freq: Once | SUBCUTANEOUS | Status: AC
  Administered 2015-03-27: 6 mg via SUBCUTANEOUS
  Filled 2015-03-27: qty 0.6

## 2015-03-27 MED ORDER — LORAZEPAM 0.5 MG PO TABS: 0.5000 mg | ORAL_TABLET | Freq: Four times a day (QID) | ORAL | Status: DC | PRN

## 2015-03-27 NOTE — Patient Instructions (Addendum)
[facility] Discharge Instructions for Patients Receiving Chemotherapy  Today you received the following chemotherapy agents Taxotere, Cytoxan   To help prevent nausea and vomiting after your treatment, we encourage you to take your nausea medication   1) Zofran 8 mg.  Take one tablet by mouth once in the morning and once in the evening with the Decadron beginning day after chemotherapy.  Take this for 3 days.    2) Continue taking Decadron as ordered previously.  3) AS NEEDED  Compazine 10 mg. Take this every 6 hours as needed for nausea  4) AS NEEDED.  Ativan.  Take this every 8 hours as needed for nausea, anxiety, or help with sleep   [facility] Discharge Instructions for Patients Receiving Chemotherapy  Today you received the following chemotherapy agents Taxotere, Cytoxan To help prevent nausea and vomiting after your treatment, we encourage you to take your nausea medication as prescribed    If you develop nausea and vomiting that is not controlled by your nausea medication, call the clinic. If it is after clinic hours your family physician or the after hours number for the clinic or go to the Emergency Department.   BELOW ARE SYMPTOMS THAT SHOULD BE REPORTED IMMEDIATELY:  *FEVER GREATER THAN 100.5 F  *CHILLS WITH OR WITHOUT FEVER  NAUSEA AND VOMITING THAT IS NOT CONTROLLED WITH YOUR NAUSEA MEDICATION  *UNUSUAL SHORTNESS OF BREATH  *UNUSUAL BRUISING OR BLEEDING  TENDERNESS IN MOUTH AND THROAT WITH OR WITHOUT PRESENCE OF ULCERS  *URINARY PROBLEMS  *BOWEL PROBLEMS  UNUSUAL RASH Items with * indicate a potential emergency and should be followed up as soon as possible.  One of the nurses will contact you 24 hours after your treatment. Please let the nurse know about any problems that you may have experienced. Feel free to call the clinic you have any questions or concerns. The clinic phone number is [phone].   I have been informed  and understand all the instructions given to me. I know to contact the clinic, my physician, or go to the Emergency Department if any problems should occur. I do not have any questions at this time, but understand that I may call the clinic during office hours   should I have any questions or need assistance in obtaining follow up care.    __________________________________________  _____________  __________ Signature of Patient or Authorized Representative            Date                   Time    __________________________________________ Nurse's Signature    mg    If you develop nausea and vomiting that is not controlled by your nausea medication, call the clinic.   BELOW ARE SYMPTOMS THAT SHOULD BE REPORTED IMMEDIATELY:  *FEVER GREATER THAN 100.5 F  *CHILLS WITH OR WITHOUT FEVER  NAUSEA AND VOMITING THAT IS NOT CONTROLLED WITH YOUR NAUSEA MEDICATION  *UNUSUAL SHORTNESS OF BREATH  *UNUSUAL BRUISING OR BLEEDING  TENDERNESS IN MOUTH AND THROAT WITH OR WITHOUT PRESENCE OF ULCERS  *URINARY PROBLEMS  *BOWEL PROBLEMS  UNUSUAL RASH Items with * indicate a potential emergency and should be followed up as soon as possible.  Feel free to call the clinic you have any questions or concerns. The clinic phone number is [phone].  Please show the [place] at check-in to the Emergency Department and triage nurse.

## 2015-03-28 ENCOUNTER — Ambulatory Visit: Payer: Medicare Other

## 2015-04-03 ENCOUNTER — Ambulatory Visit: Payer: Medicare Other | Admitting: Physical Therapy

## 2015-04-10 DIAGNOSIS — H3532 Exudative age-related macular degeneration: Secondary | ICD-10-CM | POA: Diagnosis not present

## 2015-04-10 DIAGNOSIS — H3531 Nonexudative age-related macular degeneration: Secondary | ICD-10-CM | POA: Diagnosis not present

## 2015-04-10 DIAGNOSIS — H43813 Vitreous degeneration, bilateral: Secondary | ICD-10-CM | POA: Diagnosis not present

## 2015-04-12 ENCOUNTER — Telehealth: Payer: Self-pay | Admitting: Physical Therapy

## 2015-04-12 NOTE — Telephone Encounter (Signed)
Talked to pt and scheduled her several times and pt end up cancelling appts,talked to her again today and she is not sure if she can come

## 2015-04-17 ENCOUNTER — Ambulatory Visit (HOSPITAL_BASED_OUTPATIENT_CLINIC_OR_DEPARTMENT_OTHER): Payer: Medicare Other | Admitting: Family

## 2015-04-17 ENCOUNTER — Other Ambulatory Visit (HOSPITAL_BASED_OUTPATIENT_CLINIC_OR_DEPARTMENT_OTHER): Payer: Medicare Other

## 2015-04-17 ENCOUNTER — Encounter: Payer: Self-pay | Admitting: Family

## 2015-04-17 ENCOUNTER — Ambulatory Visit (HOSPITAL_BASED_OUTPATIENT_CLINIC_OR_DEPARTMENT_OTHER): Payer: Medicare Other

## 2015-04-17 VITALS — BP 119/62 | HR 98 | Temp 97.9°F | Resp 14 | Ht 62.0 in | Wt 148.0 lb

## 2015-04-17 DIAGNOSIS — Z5111 Encounter for antineoplastic chemotherapy: Secondary | ICD-10-CM

## 2015-04-17 DIAGNOSIS — C779 Secondary and unspecified malignant neoplasm of lymph node, unspecified: Secondary | ICD-10-CM

## 2015-04-17 DIAGNOSIS — E559 Vitamin D deficiency, unspecified: Secondary | ICD-10-CM

## 2015-04-17 DIAGNOSIS — C50911 Malignant neoplasm of unspecified site of right female breast: Secondary | ICD-10-CM

## 2015-04-17 DIAGNOSIS — C50411 Malignant neoplasm of upper-outer quadrant of right female breast: Secondary | ICD-10-CM

## 2015-04-17 DIAGNOSIS — C50211 Malignant neoplasm of upper-inner quadrant of right female breast: Secondary | ICD-10-CM

## 2015-04-17 LAB — CBC WITH DIFFERENTIAL (CANCER CENTER ONLY)
BASO#: 0 10*3/uL (ref 0.0–0.2)
BASO%: 0.2 % (ref 0.0–2.0)
EOS ABS: 0 10*3/uL (ref 0.0–0.5)
EOS%: 0 % (ref 0.0–7.0)
HCT: 37.9 % (ref 34.8–46.6)
HGB: 13.1 g/dL (ref 11.6–15.9)
LYMPH#: 0.6 10*3/uL — ABNORMAL LOW (ref 0.9–3.3)
LYMPH%: 2.8 % — AB (ref 14.0–48.0)
MCH: 30 pg (ref 26.0–34.0)
MCHC: 34.6 g/dL (ref 32.0–36.0)
MCV: 87 fL (ref 81–101)
MONO#: 0.5 10*3/uL (ref 0.1–0.9)
MONO%: 2.2 % (ref 0.0–13.0)
NEUT#: 20.7 10*3/uL — ABNORMAL HIGH (ref 1.5–6.5)
NEUT%: 94.8 % — ABNORMAL HIGH (ref 39.6–80.0)
Platelets: 348 10*3/uL (ref 145–400)
RBC: 4.36 10*6/uL (ref 3.70–5.32)
RDW: 13.7 % (ref 11.1–15.7)
WBC: 21.9 10*3/uL — ABNORMAL HIGH (ref 3.9–10.0)

## 2015-04-17 LAB — CMP (CANCER CENTER ONLY)
ALBUMIN: 3.7 g/dL (ref 3.3–5.5)
ALT: 17 U/L (ref 10–47)
AST: 23 U/L (ref 11–38)
Alkaline Phosphatase: 56 U/L (ref 26–84)
BUN, Bld: 19 mg/dL (ref 7–22)
CALCIUM: 9.9 mg/dL (ref 8.0–10.3)
CHLORIDE: 105 meq/L (ref 98–108)
CO2: 26 meq/L (ref 18–33)
Creat: 1 mg/dl (ref 0.6–1.2)
Glucose, Bld: 136 mg/dL — ABNORMAL HIGH (ref 73–118)
POTASSIUM: 4.3 meq/L (ref 3.3–4.7)
SODIUM: 141 meq/L (ref 128–145)
Total Bilirubin: 1 mg/dl (ref 0.20–1.60)
Total Protein: 6.5 g/dL (ref 6.4–8.1)

## 2015-04-17 MED ORDER — DOCETAXEL CHEMO INJECTION 160 MG/16ML
60.0000 mg/m2 | Freq: Once | INTRAVENOUS | Status: AC
  Administered 2015-04-17: 110 mg via INTRAVENOUS
  Filled 2015-04-17: qty 9

## 2015-04-17 MED ORDER — SODIUM CHLORIDE 0.9 % IV SOLN
Freq: Once | INTRAVENOUS | Status: AC
  Administered 2015-04-17: 13:00:00 via INTRAVENOUS
  Filled 2015-04-17: qty 8

## 2015-04-17 MED ORDER — SODIUM CHLORIDE 0.9 % IV SOLN
Freq: Once | INTRAVENOUS | Status: AC
  Administered 2015-04-17: 13:00:00 via INTRAVENOUS

## 2015-04-17 MED ORDER — SODIUM CHLORIDE 0.9 % IV SOLN
480.0000 mg/m2 | Freq: Once | INTRAVENOUS | Status: AC
  Administered 2015-04-17: 860 mg via INTRAVENOUS
  Filled 2015-04-17: qty 43

## 2015-04-17 MED ORDER — PEGFILGRASTIM 6 MG/0.6ML ~~LOC~~ PSKT
6.0000 mg | PREFILLED_SYRINGE | Freq: Once | SUBCUTANEOUS | Status: AC
  Administered 2015-04-17: 6 mg via SUBCUTANEOUS
  Filled 2015-04-17: qty 0.6

## 2015-04-17 NOTE — Patient Instructions (Signed)
[facility] Discharge Instructions for Patients Receiving Chemotherapy  Today you received the following chemotherapy agents Taxotere, Cytoxan   To help prevent nausea and vomiting after your treatment, we encourage you to take your nausea medication   1) Zofran 8 mg.  Take one tablet by mouth once in the morning and once in the evening with the Decadron beginning day after chemotherapy.  Take this for 3 days.    2) Continue taking Decadron as ordered previously.  3) AS NEEDED  Compazine 10 mg. Take this every 6 hours as needed for nausea  4) AS NEEDED.  Ativan.  Take this every 8 hours as needed for nausea, anxiety, or help with sleep   [facility] Discharge Instructions for Patients Receiving Chemotherapy  Today you received the following chemotherapy agents Taxotere, Cytoxan To help prevent nausea and vomiting after your treatment, we encourage you to take your nausea medication as prescribed    If you develop nausea and vomiting that is not controlled by your nausea medication, call the clinic. If it is after clinic hours your family physician or the after hours number for the clinic or go to the Emergency Department.   BELOW ARE SYMPTOMS THAT SHOULD BE REPORTED IMMEDIATELY:  *FEVER GREATER THAN 100.5 F  *CHILLS WITH OR WITHOUT FEVER  NAUSEA AND VOMITING THAT IS NOT CONTROLLED WITH YOUR NAUSEA MEDICATION  *UNUSUAL SHORTNESS OF BREATH  *UNUSUAL BRUISING OR BLEEDING  TENDERNESS IN MOUTH AND THROAT WITH OR WITHOUT PRESENCE OF ULCERS  *URINARY PROBLEMS  *BOWEL PROBLEMS  UNUSUAL RASH Items with * indicate a potential emergency and should be followed up as soon as possible.  One of the nurses will contact you 24 hours after your treatment. Please let the nurse know about any problems that you may have experienced. Feel free to call the clinic you have any questions or concerns. The clinic phone number is [phone].   I have been informed  and understand all the instructions given to me. I know to contact the clinic, my physician, or go to the Emergency Department if any problems should occur. I do not have any questions at this time, but understand that I may call the clinic during office hours   should I have any questions or need assistance in obtaining follow up care.    __________________________________________  _____________  __________ Signature of Patient or Authorized Representative            Date                   Time    __________________________________________ Nurse's Signature    mg    If you develop nausea and vomiting that is not controlled by your nausea medication, call the clinic.   BELOW ARE SYMPTOMS THAT SHOULD BE REPORTED IMMEDIATELY:  *FEVER GREATER THAN 100.5 F  *CHILLS WITH OR WITHOUT FEVER  NAUSEA AND VOMITING THAT IS NOT CONTROLLED WITH YOUR NAUSEA MEDICATION  *UNUSUAL SHORTNESS OF BREATH  *UNUSUAL BRUISING OR BLEEDING  TENDERNESS IN MOUTH AND THROAT WITH OR WITHOUT PRESENCE OF ULCERS  *URINARY PROBLEMS  *BOWEL PROBLEMS  UNUSUAL RASH Items with * indicate a potential emergency and should be followed up as soon as possible.  Feel free to call the clinic you have any questions or concerns. The clinic phone number is [phone].  Please show the [facility] at check-in to the Emergency Department and triage nurse.

## 2015-04-17 NOTE — Progress Notes (Signed)
Hematology and Oncology Follow Up Visit  Regina Cardenas [id number] Apr 30, 1929 81 y.o. 04/17/2015   Principle Diagnosis:  Stage IIIA (T3N1M0) invasive ductal carcinoma of the right breast-ER positive/HER-2 negative  Current Therapy:   Taxotere/Cytoxan q 3 weeks s/p cycle 1 Neulasta OnPro    Interim History:  [name] is her today with her daughter for follow-up and cycle 2 of Taxotere/Cytoxan. She did quite well with her first cycle of treatment. She did have some mild nausea and leg cramps at 5 days after. She was constipated at the time and once she was able to have a BM both of these symptoms subsided and did not return. She did admit that she may have gotten a little dehydrated during that time but did not notify us. I told her to always call and let us know is she feels dehydrated. We can always bring her in for fluids if needed.  She had her mastectomy on March 16th. 2 out of 14 lymph nodes were positive. Her mass measured 10.3 cm and was poorly differentiated. All margins were negative.   The plan at this point is for her to complete 4 cycles of chemo and then look at possibly doing radiation.   She has been trying to lose weight and is down 12 lbs. I told her not to worry about losing weight right now since she is starting lose her taste and food is dull. Her daughter is cooking for her and she is eating well. She is also drinking lots of fluids.  She is normally very active but has had some mild fatigue since her first treatment.  She denies fevers, chills, n/v, cough, rash, dizziness, SOB, chest pain, palpitations, abdominal pain, constipation, diarrhea, blood in urine or stool.  Her vitamin D level is 30. We will have her start taking 2,000 units daily. She prefers to get this over the counter.  No swelling, tenderness, numbness or tingling in her extremities. No new aches or pains.   Medications:    Medication List       This list is accurate as of: 04/17/15  6:06 PM.   Always use your most recent med list.               aspirin 325 MG EC tablet  Take 325 mg by mouth 2 (two) times daily.     dexamethasone 4 MG tablet  Commonly known as:  DECADRON  Take 2 pills, with food, twice a day for 5 days starting day BEFORE each chemotherapy.     EYLEA IO  Inject into the eye. Every 4 weeks at eye MD     LORazepam 0.5 MG tablet  Commonly known as:  ATIVAN  Take 1 tablet (0.5 mg total) by mouth every 6 (six) hours as needed (Nausea or vomiting).     multivitamin tablet  Take 1 tablet by mouth daily.     ondansetron 8 MG tablet  Commonly known as:  ZOFRAN  Take 1 tablet (8 mg total) by mouth 2 (two) times daily. Start the day after chemo for 3 days. Then take as needed for nausea or vomiting.     PRESERVISION AREDS 2 PO  Take 2 tablets by mouth daily.     prochlorperazine 10 MG tablet  Commonly known as:  COMPAZINE  Take 1 tablet (10 mg total) by mouth every 6 (six) hours as needed (Nausea or vomiting).     tobramycin-dexamethasone ophthalmic solution  Commonly known as:  [place]  1 drop into the right eye every 4 (four) hours while awake. TAKES ONLY 3 DAYS A MONTH-- 2 DAYS BEFORE AND DAY OF EYE INJECTION        Allergies: No Known Allergies  Past Medical History, Surgical history, Social history, and Family History were reviewed and updated.  Review of Systems: All other 10 point review of systems is negative.   Physical Exam:  height is 5' 2"  (1.575 m) and weight is 148 lb (67.132 kg). Her oral temperature is 97.9 F (36.6 C). Her blood pressure is 119/62 and her pulse is 98. Her respiration is 14.   Wt Readings from Last 3 Encounters:  04/17/15 148 lb (67.132 kg)  03/07/15 162 lb (73.483 kg)  02/19/15 156 lb 12 oz (71.1 kg)    Ocular: Sclerae unicteric, pupils equal, round and reactive to light Ear-nose-throat: Oropharynx clear, dentition fair Lymphatic: No cervical or supraclavicular adenopathy Lungs no rales or rhonchi,  good excursion bilaterally Heart regular rate and rhythm, no murmur appreciated Abd soft, nontender, positive bowel sounds MSK no focal spinal tenderness, no joint edema Neuro: non-focal, well-oriented, appropriate affect Breasts: No changes. No mass, lesion, rash, or lymphadenopathy.   Lab Results  Component Value Date   WBC 21.9* 04/17/2015   HGB 13.1 04/17/2015   HCT 37.9 04/17/2015   MCV 87 04/17/2015   PLT 348 04/17/2015   No results found for: FERRITIN, IRON, TIBC, UIBC, IRONPCTSAT Lab Results  Component Value Date   RBC 4.36 04/17/2015   No results found for: KPAFRELGTCHN, LAMBDASER, KAPLAMBRATIO No results found for: IGGSERUM, IGA, IGMSERUM No results found for: Odetta Pink, SPEI   Chemistry      Component Value Date/Time   NA 141 04/17/2015 1210   NA 140 02/20/2015 0625   NA 142 01/30/2015 1233   K 4.3 04/17/2015 1210   K 4.5 02/20/2015 0625   K 4.4 01/30/2015 1233   CL 105 04/17/2015 1210   CL 107 02/20/2015 0625   CO2 26 04/17/2015 1210   CO2 29 02/20/2015 0625   CO2 25 01/30/2015 1233   BUN 19 04/17/2015 1210   BUN 10 02/20/2015 0625   BUN 12.6 01/30/2015 1233   CREATININE 1.0 04/17/2015 1210   CREATININE 0.81 02/20/2015 0625   CREATININE 0.8 01/30/2015 1233      Component Value Date/Time   CALCIUM 9.9 04/17/2015 1210   CALCIUM 8.7 02/20/2015 0625   CALCIUM 9.7 01/30/2015 1233   ALKPHOS 56 04/17/2015 1210   ALKPHOS 70 02/19/2015 1346   ALKPHOS 77 01/30/2015 1233   AST 23 04/17/2015 1210   AST 29 02/19/2015 1346   AST 19 01/30/2015 1233   ALT 17 04/17/2015 1210   ALT 17 02/19/2015 1346   ALT 12 01/30/2015 1233   BILITOT 1.00 04/17/2015 1210   BILITOT 1.2 02/19/2015 1346   BILITOT 0.59 01/30/2015 1233     Impression and Plan: Ms. Tegtmeyer is 81 year old postmenopausal white female with stage IIIa invasive ductal carcinoma of the right breast. Her tumor was quite large at 10.3 cm. She had 2  lymph nodes that were positive and lymphovascular space invasion. She did nicely with her first cycle of Taxotere/Cytoxan. She had some constipation which in turn caused some mild nausea and leg cramps. This resolved after BM and has not returned.  She promises to notify us if she begins to feel dehydrated again and we will bring her in for fluids.  We will proceed with cycle  2 today.  She did receive Neulasta with her first treatment. Her WBC count is up at 21.9.  We will also continue to monitor her weight closely. She is going to stop trying to lose weight and continue to eat well.  She will begin taking Vitamin D 2,000 units daily.    She has her current appointment schedule.  She will notify us with any questions or concerns. We can certainly see her sooner if need be.   Eliezer Bottom, NP 5/11/20166:06 PM

## 2015-04-24 ENCOUNTER — Other Ambulatory Visit: Payer: Self-pay | Admitting: Hematology & Oncology

## 2015-04-30 ENCOUNTER — Encounter: Payer: Self-pay | Admitting: *Deleted

## 2015-05-08 ENCOUNTER — Other Ambulatory Visit (HOSPITAL_BASED_OUTPATIENT_CLINIC_OR_DEPARTMENT_OTHER): Payer: Medicare Other

## 2015-05-08 ENCOUNTER — Encounter: Payer: Self-pay | Admitting: Hematology & Oncology

## 2015-05-08 ENCOUNTER — Ambulatory Visit (HOSPITAL_BASED_OUTPATIENT_CLINIC_OR_DEPARTMENT_OTHER): Payer: Medicare Other | Admitting: Hematology & Oncology

## 2015-05-08 ENCOUNTER — Ambulatory Visit (HOSPITAL_BASED_OUTPATIENT_CLINIC_OR_DEPARTMENT_OTHER): Payer: Medicare Other

## 2015-05-08 VITALS — BP 112/63 | HR 106 | Temp 98.4°F | Resp 18 | Ht 62.0 in | Wt 141.0 lb

## 2015-05-08 DIAGNOSIS — C50411 Malignant neoplasm of upper-outer quadrant of right female breast: Secondary | ICD-10-CM

## 2015-05-08 DIAGNOSIS — Z5189 Encounter for other specified aftercare: Secondary | ICD-10-CM

## 2015-05-08 DIAGNOSIS — C50911 Malignant neoplasm of unspecified site of right female breast: Secondary | ICD-10-CM

## 2015-05-08 DIAGNOSIS — Z5111 Encounter for antineoplastic chemotherapy: Secondary | ICD-10-CM | POA: Diagnosis present

## 2015-05-08 DIAGNOSIS — Z170 Estrogen receptor positive status [ER+]: Secondary | ICD-10-CM

## 2015-05-08 DIAGNOSIS — C50211 Malignant neoplasm of upper-inner quadrant of right female breast: Secondary | ICD-10-CM

## 2015-05-08 LAB — CBC WITH DIFFERENTIAL (CANCER CENTER ONLY)
BASO#: 0 10*3/uL (ref 0.0–0.2)
BASO%: 0.2 % (ref 0.0–2.0)
EOS%: 0 % (ref 0.0–7.0)
Eosinophils Absolute: 0 10*3/uL (ref 0.0–0.5)
HEMATOCRIT: 34.7 % — AB (ref 34.8–46.6)
HEMOGLOBIN: 11.9 g/dL (ref 11.6–15.9)
LYMPH#: 0.7 10*3/uL — ABNORMAL LOW (ref 0.9–3.3)
LYMPH%: 3.5 % — ABNORMAL LOW (ref 14.0–48.0)
MCH: 30.2 pg (ref 26.0–34.0)
MCHC: 34.3 g/dL (ref 32.0–36.0)
MCV: 88 fL (ref 81–101)
MONO#: 0.7 10*3/uL (ref 0.1–0.9)
MONO%: 3.4 % (ref 0.0–13.0)
NEUT%: 92.9 % — ABNORMAL HIGH (ref 39.6–80.0)
NEUTROS ABS: 17.8 10*3/uL — AB (ref 1.5–6.5)
PLATELETS: 279 10*3/uL (ref 145–400)
RBC: 3.94 10*6/uL (ref 3.70–5.32)
RDW: 15.3 % (ref 11.1–15.7)
WBC: 19.2 10*3/uL — ABNORMAL HIGH (ref 3.9–10.0)

## 2015-05-08 LAB — CMP (CANCER CENTER ONLY)
ALT(SGPT): 22 U/L (ref 10–47)
AST: 24 U/L (ref 11–38)
Albumin: 3.3 g/dL (ref 3.3–5.5)
Alkaline Phosphatase: 53 U/L (ref 26–84)
BUN, Bld: 12 mg/dL (ref 7–22)
CHLORIDE: 102 meq/L (ref 98–108)
CO2: 21 mEq/L (ref 18–33)
CREATININE: 1.1 mg/dL (ref 0.6–1.2)
Calcium: 9.3 mg/dL (ref 8.0–10.3)
GLUCOSE: 152 mg/dL — AB (ref 73–118)
Potassium: 4.1 mEq/L (ref 3.3–4.7)
Sodium: 136 mEq/L (ref 128–145)
TOTAL PROTEIN: 5.8 g/dL — AB (ref 6.4–8.1)
Total Bilirubin: 1.1 mg/dl (ref 0.20–1.60)

## 2015-05-08 MED ORDER — SODIUM CHLORIDE 0.9 % IV SOLN
Freq: Once | INTRAVENOUS | Status: AC
  Administered 2015-05-08: 12:00:00 via INTRAVENOUS

## 2015-05-08 MED ORDER — SODIUM CHLORIDE 0.9 % IV SOLN
450.0000 mg/m2 | Freq: Once | INTRAVENOUS | Status: AC
  Administered 2015-05-08: 800 mg via INTRAVENOUS
  Filled 2015-05-08: qty 40

## 2015-05-08 MED ORDER — PEGFILGRASTIM 6 MG/0.6ML ~~LOC~~ PSKT
6.0000 mg | PREFILLED_SYRINGE | Freq: Once | SUBCUTANEOUS | Status: AC
  Administered 2015-05-08: 6 mg via SUBCUTANEOUS
  Filled 2015-05-08: qty 0.6

## 2015-05-08 MED ORDER — SODIUM CHLORIDE 0.9 % IV SOLN
Freq: Once | INTRAVENOUS | Status: AC
  Administered 2015-05-08: 12:00:00 via INTRAVENOUS
  Filled 2015-05-08: qty 8

## 2015-05-08 MED ORDER — DOCETAXEL CHEMO INJECTION 160 MG/16ML
56.2500 mg/m2 | Freq: Once | INTRAVENOUS | Status: AC
  Administered 2015-05-08: 100 mg via INTRAVENOUS
  Filled 2015-05-08: qty 10

## 2015-05-08 NOTE — Patient Instructions (Signed)
[facility] Discharge Instructions for Patients Receiving Chemotherapy  Today you received the following chemotherapy agents: Taxotere, Carboplatin. To help prevent nausea and vomiting after your treatment, we encourage you to take your nausea medication.  If you develop nausea and vomiting that is not controlled by your nausea medication, call the clinic.   BELOW ARE SYMPTOMS THAT SHOULD BE REPORTED IMMEDIATELY:  *FEVER GREATER THAN 100.5 F  *CHILLS WITH OR WITHOUT FEVER  NAUSEA AND VOMITING THAT IS NOT CONTROLLED WITH YOUR NAUSEA MEDICATION  *UNUSUAL SHORTNESS OF BREATH  *UNUSUAL BRUISING OR BLEEDING  TENDERNESS IN MOUTH AND THROAT WITH OR WITHOUT PRESENCE OF ULCERS  *URINARY PROBLEMS  *BOWEL PROBLEMS  UNUSUAL RASH Items with * indicate a potential emergency and should be followed up as soon as possible.  Feel free to call the clinic you have any questions or concerns. The clinic phone number is [phone].  Please show the CHEMO ALERT CARD at check-in to the Emergency Department and triage nurse.   

## 2015-05-08 NOTE — Progress Notes (Signed)
Hematology and Oncology Follow Up Visit  Regina Cardenas [id number] 19-Nov-1929 81 y.o. 05/08/2015   Principle Diagnosis:   Stage IIIA (T3N1M0) invasive ductal carcinoma of the right breast-ER positive/HER-2 negative  Current Therapy:    Status post Docetaxel/Cytoxan cycle #2  Neulasta 6 mg subcutaneous postchemotherapy     Interim History:  Regina Cardenas is back for follow-up. She is having a little bit of a tough time with treatment. She had 2 cycles of treatment. She is lost most of her hair.  [name] just felt fatigued. [name] felt weak. Her appetite is down. Patient has had some nausea but no vomiting. [name] has had some diarrhea. I told her to take some Imodium if she has diarrhea again.  [name] had no leg swelling. [name] had no arm swelling. Per she's noticed a lump under the right arm.  [name] had no fever. [name] had no mouth sores. She seems to be swallowing okay.    Overall, her performance status is ECOG 1.  Medications:  Current outpatient prescriptions:  .  Aflibercept (EYLEA IO), Inject into the eye. Every 4 weeks at eye MD, Disp: , Rfl:  .  aspirin 325 MG EC tablet, Take 325 mg by mouth 2 (two) times daily. , Disp: , Rfl:  .  dexamethasone (DECADRON) 4 MG tablet, Take 2 pills, with food, twice a day for 5 days starting day BEFORE each chemotherapy., Disp: 80 tablet, Rfl: 0 .  LORazepam (ATIVAN) 0.5 MG tablet, Take 1 tablet (0.5 mg total) by mouth every 6 (six) hours as needed (Nausea or vomiting)., Disp: 30 tablet, Rfl: 0 .  Multiple Vitamin (MULTIVITAMIN) tablet, Take 1 tablet by mouth daily., Disp: , Rfl:  .  Multiple Vitamins-Minerals (PRESERVISION AREDS 2 PO), Take 2 tablets by mouth daily., Disp: , Rfl:  .  ondansetron (ZOFRAN) 8 MG tablet, Take 1 tablet (8 mg total) by mouth 2 (two) times daily. Start the day after chemo for 3 days. Then take as needed for nausea or vomiting., Disp: 30 tablet, Rfl: 1 .  prochlorperazine (COMPAZINE) 10 MG tablet, Take 1 tablet  (10 mg total) by mouth every 6 (six) hours as needed (Nausea or vomiting). (Patient not taking: Reported on 04/17/2015), Disp: 30 tablet, Rfl: 1 .  tobramycin-dexamethasone (TOBRADEX) ophthalmic solution, Place 1 drop into the right eye every 4 (four) hours while awake. TAKES ONLY 3 DAYS A MONTH-- 2 DAYS BEFORE AND DAY OF EYE INJECTION, Disp: , Rfl:   Allergies: No Known Allergies  Past Medical History, Surgical history, Social history, and Family History were reviewed and updated.  Review of Systems: As above  Physical Exam:  height is 5' 2"  (1.575 m) and weight is 141 lb (63.957 kg). Her oral temperature is 98.4 F (36.9 C). Her blood pressure is 112/63 and her pulse is 106. Her respiration is 18.   Wt Readings from Last 3 Encounters:  05/08/15 141 lb (63.957 kg)  04/17/15 148 lb (67.132 kg)  03/07/15 162 lb (73.483 kg)     [name] in no obvious distress. Head and neck exam shows no ocular or oral lesions. She has no palpable cervical or supraclavicular lymph nodes. Thyroid is none palpable. Lungs are clear bilaterally. Cardiac exam regular rate and rhythm with no murmurs, rubs or bruits. Abdomen is soft. She has good bowel sounds. There is no fluid wave. There is no palpable hepatosplenomegaly. Breast exam shows left breast with no masses, edema or erythema. There is no left axillary adenopathy.  Right chest wall shows healing mastectomy. She has no erythema or nodularity along the right chest wall.. There is an obvious mass in the right axilla. This probably measures about 3 cm. It is non-mobile. It is nontender. Extremities shows no clubbing, cyanosis or edema. Skin exam shows no rashes, ecchymoses or petechia.  Lab Results  Component Value Date   WBC 19.2* 05/08/2015   HGB 11.9 05/08/2015   HCT 34.7* 05/08/2015   MCV 88 05/08/2015   PLT 279 05/08/2015     Chemistry      Component Value Date/Time   NA 136 05/08/2015 1041   NA 140 02/20/2015 0625     NA 142 01/30/2015 1233   K 4.1 05/08/2015 1041   K 4.5 02/20/2015 0625   K 4.4 01/30/2015 1233   CL 102 05/08/2015 1041   CL 107 02/20/2015 0625   CO2 21 05/08/2015 1041   CO2 29 02/20/2015 0625   CO2 25 01/30/2015 1233   BUN 12 05/08/2015 1041   BUN 10 02/20/2015 0625   BUN 12.6 01/30/2015 1233   CREATININE 1.1 05/08/2015 1041   CREATININE 0.81 02/20/2015 0625   CREATININE 0.8 01/30/2015 1233      Component Value Date/Time   CALCIUM 9.3 05/08/2015 1041   CALCIUM 8.7 02/20/2015 0625   CALCIUM 9.7 01/30/2015 1233   ALKPHOS 53 05/08/2015 1041   ALKPHOS 70 02/19/2015 1346   ALKPHOS 77 01/30/2015 1233   AST 24 05/08/2015 1041   AST 29 02/19/2015 1346   AST 19 01/30/2015 1233   ALT 22 05/08/2015 1041   ALT 17 02/19/2015 1346   ALT 12 01/30/2015 1233   BILITOT 1.10 05/08/2015 1041   BILITOT 1.2 02/19/2015 1346   BILITOT 0.59 01/30/2015 1233         Impression and Plan: Regina Cardenas is 81 year old postmenopausal white female. She has stage IIIa invasive ductal carcinoma of the right breast. She had a very large tumor. She had 2 lymph nodes that were positive. She had lymphovascular space invasion.  I am a little bit puzzled as to what is going on in the right axilla. I would be surprised if she had recurrence in this area. I don't know if this is a seroma that I'm palpating.  She needs a ultrasound. We will get this set up for her in a couple of weeks.  I will reduce the dose of treatment just a little bit more. Hopefully, she will tolerate this treatment well.  We will treat her with a total of 4 cycles of chemotherapy. Her last cycle will be in 3 weeks.  After her chemotherapy, she will clearly need radiation therapy. She had a large primary. She had to actually lymph nodes.  We will see what the ultrasound shows. If we have to, we may need to get surgery involved or at least radiology involved for a biopsy.  I don't think she needs to start treatment for another 3  weeks area and she still needs to heal up a low bit from her surgery.  I want to see her back myself in 3 weeks for her fourth and final cycle of treatment.   Regina Napoleon, MD 6/1/20164:01 PM

## 2015-05-10 DIAGNOSIS — H3532 Exudative age-related macular degeneration: Secondary | ICD-10-CM | POA: Diagnosis not present

## 2015-05-10 DIAGNOSIS — H3531 Nonexudative age-related macular degeneration: Secondary | ICD-10-CM | POA: Diagnosis not present

## 2015-05-10 DIAGNOSIS — H43813 Vitreous degeneration, bilateral: Secondary | ICD-10-CM | POA: Diagnosis not present

## 2015-05-14 ENCOUNTER — Ambulatory Visit
Admission: RE | Admit: 2015-05-14 | Discharge: 2015-05-14 | Disposition: A | Discharge status: Home / Self Care | Payer: Medicare Other | Source: Ambulatory Visit | Attending: Hematology & Oncology | Admitting: Hematology & Oncology

## 2015-05-14 ENCOUNTER — Other Ambulatory Visit: Payer: Self-pay | Admitting: Hematology & Oncology

## 2015-05-14 ENCOUNTER — Other Ambulatory Visit: Payer: Medicare Other

## 2015-05-14 DIAGNOSIS — C50211 Malignant neoplasm of upper-inner quadrant of right female breast: Secondary | ICD-10-CM

## 2015-05-14 DIAGNOSIS — N6001 Solitary cyst of right breast: Secondary | ICD-10-CM | POA: Diagnosis not present

## 2015-05-27 ENCOUNTER — Telehealth: Payer: Self-pay | Admitting: *Deleted

## 2015-05-27 NOTE — Telephone Encounter (Signed)
Patient wants to cancel this Wednesdays appointments. She states she is weak and suffering from too many complications from the chemotherapy. She no longer wants treatment. This Wednesday she is scheduled for lab, MD and infusion. Spoke to patient and she agrees to come in for lab and to see Dr Marin Olp. She agrees to speak with the doctor about her decision and to see if there are options for symptom management. Notified Dr Marin Olp of patient's decision. He will follow up with her on Wednesday.

## 2015-05-29 ENCOUNTER — Ambulatory Visit (HOSPITAL_BASED_OUTPATIENT_CLINIC_OR_DEPARTMENT_OTHER): Payer: Medicare Other

## 2015-05-29 ENCOUNTER — Telehealth: Payer: Self-pay | Admitting: Hematology & Oncology

## 2015-05-29 ENCOUNTER — Other Ambulatory Visit (HOSPITAL_BASED_OUTPATIENT_CLINIC_OR_DEPARTMENT_OTHER): Payer: Medicare Other

## 2015-05-29 ENCOUNTER — Encounter: Payer: Self-pay | Admitting: Hematology & Oncology

## 2015-05-29 ENCOUNTER — Ambulatory Visit (HOSPITAL_BASED_OUTPATIENT_CLINIC_OR_DEPARTMENT_OTHER): Payer: Medicare Other | Admitting: Hematology & Oncology

## 2015-05-29 VITALS — BP 93/59 | HR 88 | Temp 98.0°F | Resp 16 | Ht 63.0 in | Wt 129.0 lb

## 2015-05-29 DIAGNOSIS — R197 Diarrhea, unspecified: Secondary | ICD-10-CM | POA: Diagnosis not present

## 2015-05-29 DIAGNOSIS — C779 Secondary and unspecified malignant neoplasm of lymph node, unspecified: Secondary | ICD-10-CM

## 2015-05-29 DIAGNOSIS — C50411 Malignant neoplasm of upper-outer quadrant of right female breast: Secondary | ICD-10-CM

## 2015-05-29 DIAGNOSIS — C50211 Malignant neoplasm of upper-inner quadrant of right female breast: Secondary | ICD-10-CM

## 2015-05-29 DIAGNOSIS — T148 Other injury of unspecified body region: Secondary | ICD-10-CM

## 2015-05-29 DIAGNOSIS — C50911 Malignant neoplasm of unspecified site of right female breast: Secondary | ICD-10-CM

## 2015-05-29 LAB — CBC WITH DIFFERENTIAL (CANCER CENTER ONLY)
BASO#: 0.1 10*3/uL (ref 0.0–0.2)
BASO%: 1 % (ref 0.0–2.0)
EOS ABS: 0.1 10*3/uL (ref 0.0–0.5)
EOS%: 0.5 % (ref 0.0–7.0)
HEMATOCRIT: 34.2 % — AB (ref 34.8–46.6)
HGB: 11.7 g/dL (ref 11.6–15.9)
LYMPH#: 0.5 10*3/uL — ABNORMAL LOW (ref 0.9–3.3)
LYMPH%: 3.8 % — AB (ref 14.0–48.0)
MCH: 29.8 pg (ref 26.0–34.0)
MCHC: 34.2 g/dL (ref 32.0–36.0)
MCV: 87 fL (ref 81–101)
MONO#: 1.1 10*3/uL — ABNORMAL HIGH (ref 0.1–0.9)
MONO%: 8.6 % (ref 0.0–13.0)
NEUT#: 10.8 10*3/uL — ABNORMAL HIGH (ref 1.5–6.5)
NEUT%: 86.1 % — AB (ref 39.6–80.0)
Platelets: 342 10*3/uL (ref 145–400)
RBC: 3.93 10*6/uL (ref 3.70–5.32)
RDW: 14.9 % (ref 11.1–15.7)
WBC: 12.5 10*3/uL — ABNORMAL HIGH (ref 3.9–10.0)

## 2015-05-29 LAB — CMP (CANCER CENTER ONLY)
ALBUMIN: 3 g/dL — AB (ref 3.3–5.5)
ALK PHOS: 65 U/L (ref 26–84)
ALT(SGPT): 14 U/L (ref 10–47)
AST: 22 U/L (ref 11–38)
BUN, Bld: 13 mg/dL (ref 7–22)
CO2: 25 meq/L (ref 18–33)
Calcium: 9.4 mg/dL (ref 8.0–10.3)
Chloride: 99 mEq/L (ref 98–108)
Creat: 0.8 mg/dl (ref 0.6–1.2)
Glucose, Bld: 112 mg/dL (ref 73–118)
POTASSIUM: 3.2 meq/L — AB (ref 3.3–4.7)
SODIUM: 132 meq/L (ref 128–145)
Total Bilirubin: 1.3 mg/dl (ref 0.20–1.60)
Total Protein: 5.5 g/dL — ABNORMAL LOW (ref 6.4–8.1)

## 2015-05-29 MED ORDER — METRONIDAZOLE 500 MG PO TABS: 500.0000 mg | ORAL_TABLET | Freq: Three times a day (TID) | ORAL | Status: DC

## 2015-05-29 MED ORDER — FAMOTIDINE IN NACL 20-0.9 MG/50ML-% IV SOLN
INTRAVENOUS | Status: AC
  Filled 2015-05-29: qty 100

## 2015-05-29 MED ORDER — SODIUM CHLORIDE 0.9 % IV SOLN
1000.0000 mL | INTRAVENOUS | Status: DC
  Administered 2015-05-29: 12:00:00 via INTRAVENOUS

## 2015-05-29 MED ORDER — FAMOTIDINE IN NACL 20-0.9 MG/50ML-% IV SOLN
40.0000 mg | Freq: Two times a day (BID) | INTRAVENOUS | Status: DC
  Administered 2015-05-29: 40 mg via INTRAVENOUS

## 2015-05-29 MED ORDER — POTASSIUM CHLORIDE ER 8 MEQ PO TBCR: 8.0000 meq | EXTENDED_RELEASE_TABLET | Freq: Every day | ORAL | Status: AC

## 2015-05-29 NOTE — Telephone Encounter (Signed)
Tried to contact pt regard future appt in July couldn't leave message mailbox full.  Mailed out calendar

## 2015-05-29 NOTE — Patient Instructions (Signed)
Dehydration, Adult Dehydration is when you lose more fluids from the body than you take in. Vital organs like the kidneys, brain, and heart cannot function without a proper amount of fluids and salt. Any loss of fluids from the body can cause dehydration.  CAUSES   Vomiting.  Diarrhea.  Excessive sweating.  Excessive urine output.  Fever. SYMPTOMS  Mild dehydration  Thirst.  Dry lips.  Slightly dry mouth. Moderate dehydration  Very dry mouth.  Sunken eyes.  Skin does not bounce back quickly when lightly pinched and released.  Dark urine and decreased urine production.  Decreased tear production.  Headache. Severe dehydration  Very dry mouth.  Extreme thirst.  Rapid, weak pulse (more than 100 beats per minute at rest).  Cold hands and feet.  Not able to sweat in spite of heat and temperature.  Rapid breathing.  Blue lips.  Confusion and lethargy.  Difficulty being awakened.  Minimal urine production.  No tears. DIAGNOSIS  Your caregiver will diagnose dehydration based on your symptoms and your exam. Blood and urine tests will help confirm the diagnosis. The diagnostic evaluation should also identify the cause of dehydration. TREATMENT  Treatment of mild or moderate dehydration can often be done at home by increasing the amount of fluids that you drink. It is best to drink small amounts of fluid more often. Drinking too much at one time can make vomiting worse. Refer to the home care instructions below. Severe dehydration needs to be treated at the hospital where you will probably be given intravenous (IV) fluids that contain water and electrolytes. HOME CARE INSTRUCTIONS   Ask your caregiver about specific rehydration instructions.  Drink enough fluids to keep your urine clear or pale yellow.  Drink small amounts frequently if you have nausea and vomiting.  Eat as you normally do.  Avoid:  Foods or drinks high in sugar.  Carbonated  drinks.  Juice.  Extremely hot or cold fluids.  Drinks with caffeine.  Fatty, greasy foods.  Alcohol.  Tobacco.  Overeating.  Gelatin desserts.  Wash your hands well to avoid spreading bacteria and viruses.  Only take over-the-counter or prescription medicines for pain, discomfort, or fever as directed by your caregiver.  Ask your caregiver if you should continue all prescribed and over-the-counter medicines.  Keep all follow-up appointments with your caregiver. SEEK MEDICAL CARE IF:  You have abdominal pain and it increases or stays in one area (localizes).  You have a rash, stiff neck, or severe headache.  You are irritable, sleepy, or difficult to awaken.  You are weak, dizzy, or extremely thirsty. SEEK IMMEDIATE MEDICAL CARE IF:   You are unable to keep fluids down or you get worse despite treatment.  You have frequent episodes of vomiting or diarrhea.  You have blood or green matter (bile) in your vomit.  You have blood in your stool or your stool looks black and tarry.  You have not urinated in 6 to 8 hours, or you have only urinated a small amount of very dark urine.  You have a fever.  You faint. MAKE SURE YOU:   Understand these instructions.  Will watch your condition.  Will get help right away if you are not doing well or get worse. Document Released: 11/23/2005 Document Revised: 02/15/2012 Document Reviewed: 07/13/2011 ExitCare Patient Information 2015 [employer]. This information is not intended to replace advice given to you by your health care provider. Make sure you discuss any questions you have with your health care   provider.  

## 2015-05-29 NOTE — Progress Notes (Signed)
Hematology and Oncology Follow Up Visit  Regina Cardenas [id number] July 21, 1929 81 y.o. 05/29/2015   Principle Diagnosis:   Stage IIIA (T3N1M0) invasive ductal carcinoma of the right breast-ER positive/HER-2 negative  Current Therapy:    Status post Docetaxel/Cytoxan cycle #3  Neulasta 6 mg subcutaneous postchemotherapy     Interim History:  Regina Cardenas is back for follow-up. She has really had tell time with chemotherapy. Even though we reduced her chemotherapy dosage the past 2 cycles, she is just really in a tough way. [name] lost quite a bit of weight. She has no appetite. She says is hard for her to swallow. She looks dehydrated.  There is absolutely no way that we can proceed with any further chemotherapy. I think that we are okay. She got 3 out of 4 cycles.  She probably had a little bit of infection at the mastectomy site drained by the surgeon a week or so ago.  She comes in in a wheelchair. Again she just looks dehydrated. We will have to give her some fluids. Patient having quite a bit of diarrhea. [name] not taking anything for this. I told her to try some Imodium. I told her daughter to get her some over-the-counter probiotic.  She clearly is going to need radiation treatments. She has a large primary breast lesion. She has several lymph nodes that are positive.  [name] had no leg swelling. [name] had no fever. [name] had no bleeding.  Overall, her performance status is ECOG 3.  .  Medications:  Current outpatient prescriptions:  .  Aflibercept (EYLEA IO), Inject into the eye. Every 4 weeks at eye MD, Disp: , Rfl:  .  aspirin 325 MG EC tablet, Take 325 mg by mouth 2 (two) times daily. , Disp: , Rfl:  .  metroNIDAZOLE (FLAGYL) 500 MG tablet, Take 1 tablet (500 mg total) by mouth 3 (three) times daily., Disp: 15 tablet, Rfl: 0 .  Multiple Vitamin (MULTIVITAMIN) tablet, Take 1 tablet by mouth daily., Disp: , Rfl:  .  Multiple Vitamins-Minerals (PRESERVISION AREDS 2  PO), Take 2 tablets by mouth daily., Disp: , Rfl:  .  potassium chloride (KLOR-CON) 8 MEQ tablet, Take 1 tablet (8 mEq total) by mouth daily., Disp: 14 tablet, Rfl: 0 .  tobramycin-dexamethasone (TOBRADEX) ophthalmic solution, Place 1 drop into the right eye every 4 (four) hours while awake. TAKES ONLY 3 DAYS A MONTH-- 2 DAYS BEFORE AND DAY OF EYE INJECTION, Disp: , Rfl:  No current facility-administered medications for this visit.  Facility-Administered Medications Ordered in Other Visits:  .  0.9 %  sodium chloride infusion, 1,000 mL, Intravenous, Continuous, Volanda Napoleon, MD, Stopped at 05/29/15 1450 .  famotidine (PEPCID) IVPB 20 mg premix, 40 mg, Intravenous, Q12H, Volanda Napoleon, MD, 40 mg at 05/29/15 1250  Allergies: No Known Allergies  Past Medical History, Surgical history, Social history, and Family History were reviewed and updated.  Review of Systems: As above  Physical Exam:  height is _0  (1.6 m) and weight is 129 lb (58.514 kg). Her oral temperature is 98 F (36.7 C). Her blood pressure is 93/59 and her pulse is 88. Her respiration is 16.   Wt Readings from Last 3 Encounters:  05/29/15 129 lb (58.514 kg)  05/08/15 141 lb (63.957 kg)  04/17/15 148 lb (67.132 kg)     [name] in no obvious distress. Head and neck exam shows no ocular or oral lesions. She has no palpable cervical or  supraclavicular lymph nodes. Thyroid is none palpable. Lungs are clear bilaterally. Cardiac exam regular rate and rhythm with no murmurs, rubs or bruits. Abdomen is soft. She has good bowel sounds. There is no fluid wave. There is no palpable hepatosplenomegaly. Breast exam shows left breast with no masses, edema or erythema. There is no left axillary adenopathy. Right chest wall shows healing mastectomy. She has no erythema or nodularity along the right chest wall.. There is an obvious mass in the right axilla. This probably measures about 3 cm. It is  non-mobile. It is nontender. Extremities shows no clubbing, cyanosis or edema. Skin exam shows no rashes, ecchymoses or petechia.  Lab Results  Component Value Date   WBC 12.5* 05/29/2015   HGB 11.7 05/29/2015   HCT 34.2* 05/29/2015   MCV 87 05/29/2015   PLT 342 05/29/2015     Chemistry      Component Value Date/Time   NA 132 05/29/2015 1111   NA 140 02/20/2015 0625   NA 142 01/30/2015 1233   K 3.2* 05/29/2015 1111   K 4.5 02/20/2015 0625   K 4.4 01/30/2015 1233   CL 99 05/29/2015 1111   CL 107 02/20/2015 0625   CO2 25 05/29/2015 1111   CO2 29 02/20/2015 0625   CO2 25 01/30/2015 1233   BUN 13 05/29/2015 1111   BUN 10 02/20/2015 0625   BUN 12.6 01/30/2015 1233   CREATININE 0.8 05/29/2015 1111   CREATININE 0.81 02/20/2015 0625   CREATININE 0.8 01/30/2015 1233      Component Value Date/Time   CALCIUM 9.4 05/29/2015 1111   CALCIUM 8.7 02/20/2015 0625   CALCIUM 9.7 01/30/2015 1233   ALKPHOS 65 05/29/2015 1111   ALKPHOS 70 02/19/2015 1346   ALKPHOS 77 01/30/2015 1233   AST 22 05/29/2015 1111   AST 29 02/19/2015 1346   AST 19 01/30/2015 1233   ALT 14 05/29/2015 1111   ALT 17 02/19/2015 1346   ALT 12 01/30/2015 1233   BILITOT 1.30 05/29/2015 1111   BILITOT 1.2 02/19/2015 1346   BILITOT 0.59 01/30/2015 1233         Impression and Plan: Regina Cardenas is 81 year old postmenopausal white female. She has stage IIIa invasive ductal carcinoma of the right breast. She had a very large tumor. She had 2 lymph nodes that were positive. She had lymphovascular space invasion.  I am going to stop her chemotherapy. Again, she got 3 cycles and I think that is appropriate.  She had a ultrasound of the right axilla. This showed a seroma. I think this was what was trained by the surgeons.  I will call radiation oncology. I spoke with Dr. Teryl Lucy. He will get her in in a couple weeks. She will need radiation for local control.  She will need aromatase inhibitor therapy. We can  embark upon that when she finishes her radiation.  I think she also would be a good candidate for low-dose Xgeva. I think some latest studies seem to suggest a benefit to twice a year low-dose Xgeva.  We will give her IV fluids.  I spent about 45 minutes with she and her daughter. Regina Cardenas is relieved that she does not need any more chemotherapy.  I would like to see her back in another 4-5 weeks.   Volanda Napoleon, MD 6/22/20166:14 PM

## 2015-06-07 ENCOUNTER — Inpatient Hospital Stay: Admission: RE | Admit: 2015-06-07 | Payer: Self-pay | Source: Ambulatory Visit | Admitting: Radiation Oncology

## 2015-06-07 NOTE — Progress Notes (Signed)
Location of Breast Cancer: Breast cancer of upper-inner quadrant of right female breast  Histology per Pathology Report:   02/20/15  Diagnosis 1. Breast, simple mastectomy, right, including lymph nodes - INVASIVE DUCTAL CARCINOMA, SEE COMMENT. - DUCTAL CARCINOMA IN SITU. - POSITIVE FOR BREAST PARENCHYMAL LYMPH VASCULAR INVASION. - SKIN, POSITIVE FOR TUMOR. - POSITIVE FOR DERMAL LYMPH VASCULAR INVASION. - PREVIOUS BIOPSY SITE. - TWO LYMPH NODES, POSITIVE FOR METASTATIC MAMMARY CARCINOMA (2/10). - INTRANODAL TUMOR DEPOSITS ARE 1.5 CM AND 1.0 MM - NEGATIVE FOR EXTRACAPSULAR TUMOR EXTENSION. - SEE TUMOR SYNOPTIC TEMPLATE BELOW. 1 of 4 FINAL for LENDORA, KEYS ([id number]) Diagnosis(continued) 2. Lymph node, sentinel, biopsy, right #1 - ONE LYMPH NODE,NEGATIVE FOR TUMOR (0/1). 3. Lymph node, sentinel, biopsy, right #2 - ONE LYMPH NODE, NEGATIVE FOR TUMOR (0/1). 4. Lymph node, sentinel, biopsy, right #3 - ONE LYMPH NODE, NEGATIVE FOR TUMOR (0/1). 5. Lymph node, biopsy, additional right - ONE LYMPH NODE, NEGATIVE FOR TUMOR (0/1).  02/05/15 Diagnosis Breast, right, needle core biopsy, UOQ, 10 o'clock, 4 cm from nipple - INVASIVE DUCTAL CARCINOMA, SEE COMMENT  01/16/15 Diagnosis Breast, right, needle core biopsy, mass, 10 o'clock - INVASIVE DUCTAL CARCINOMA, SEE COMMENT. - DUCTAL CARCINOMA IN SITU WITH NECROSIS. - SEE COMMENT.  Receptor Status: ER(100%), PR (100%), Her2-neu (negative)  Did patient present with symptoms (if so, please note symptoms) or was this found on screening mammography?: She noticed some bleeding from the right breast back in October. This seemed to be intermittent. It then became more of a problem for her. She subsequently underwent a mammogram. She was found to have a mass in the right breast measuring 9.6 cm. There was also a second mass measuring 1.9 cm.  Past/Anticipated interventions by surgeon, if any: 02/19/15 - Procedure: TOTAL  MASTECTOMY WITH   SENTINEL LYMPH  NODE BIOPSY;  Surgeon: Fanny Skates, MD;  Location: [facility];  Service: General;  Laterality: Right;  Past/Anticipated interventions by medical oncology, if any: Status post Docetaxel/Cytoxan cycle #3, Neulasta 6 mg subcutaneous postchemotherapy.    Lymphedema issues, if any:  Has some swelling in her right hand   Pain issues, if any:  no   OB Gyn: unsure of when she had her first period.  She guesses 12-13 years.  She has 2 children who were adopted.  She has not used hormones.  She was in her 5's when she went though menopause.  SAFETY ISSUES:  Prior radiation? no  Pacemaker/ICD? no  Possible current pregnancy?no  Is the patient on methotrexate? no  Current Complaints / other details:  Patient is here with her daughter.  BP 117/65 mmHg  Pulse 104  Temp(Src) 98.3 F (36.8 C) (Oral)  Resp 12  Ht 5' 3" (1.6 m)  Wt 136 lb 3.2 oz (61.78 kg)  BMI 24.13 kg/m2

## 2015-06-11 ENCOUNTER — Ambulatory Visit
Admission: RE | Admit: 2015-06-11 | Discharge: 2015-06-11 | Disposition: A | Discharge status: Home / Self Care | Payer: Medicare Other | Source: Ambulatory Visit | Attending: Radiation Oncology | Admitting: Radiation Oncology

## 2015-06-11 ENCOUNTER — Encounter: Payer: Self-pay | Admitting: Radiation Oncology

## 2015-06-11 VITALS — BP 117/65 | HR 104 | Temp 98.3°F | Resp 12 | Ht 63.0 in | Wt 136.2 lb

## 2015-06-11 DIAGNOSIS — C50211 Malignant neoplasm of upper-inner quadrant of right female breast: Secondary | ICD-10-CM | POA: Diagnosis not present

## 2015-06-11 NOTE — Progress Notes (Signed)
Radiation Oncology         (336) [phone] ________________________________  Name: Regina Cardenas MRN: [id number]  Date: 06/11/2015  DOB: Jun 14, 1929  Reevaluation Note  CC: No PCP Per Patient  Volanda Napoleon, MD    ICD-9-CM ICD-10-CM   1. Breast cancer of upper-inner quadrant of right female breast 174.2 C50.211     Diagnosis:  Stage IIIA (T3N1M0) invasive ductal carcinoma of the right breast-ER positive/HER-2 negative   Narrative:  The patient returns today for further evaluation. The patient was initially seen by Dr. Lisbeth Renshaw. The patient was originally scheduled to receive 4 cycles of adjuvant chemotherapy, however after the patient's third cycle she developed severe fatigue. In light of this the patient's fourth adjuvant cycle was discontinued. The patient now is ready to proceed with her postmastectomy irradiation. Patient did have a fluid collection in the right axillary area drained recently. Ultrasound imaging was consistent with a seroma .She denies any pain in her right axillary region at this time. She denies any chills or fever or problems with swelling in her right arm.                             ALLERGIES:  has No Known Allergies.  Meds: Current Outpatient Prescriptions  Medication Sig Dispense Refill  . Aflibercept (EYLEA IO) Inject into the eye. Every 4 weeks at eye MD    . potassium chloride (KLOR-CON) 8 MEQ tablet Take 1 tablet (8 mEq total) by mouth daily. 14 tablet 0  . tobramycin-dexamethasone (TOBRADEX) ophthalmic solution Place 1 drop into the right eye every 4 (four) hours while awake. TAKES ONLY 3 DAYS A MONTH-- 2 DAYS BEFORE AND DAY OF EYE INJECTION    . aspirin 325 MG EC tablet Take 325 mg by mouth 2 (two) times daily.     . cholecalciferol (VITAMIN D) 1000 UNITS tablet Take 2,000 Units by mouth daily.    . metroNIDAZOLE (FLAGYL) 500 MG tablet Take 1 tablet (500 mg total) by mouth 3 (three) times daily. (Patient not taking: Reported on 06/11/2015) 15 tablet 0    . Multiple Vitamin (MULTIVITAMIN) tablet Take 1 tablet by mouth daily.    . Multiple Vitamins-Minerals (PRESERVISION AREDS 2 PO) Take 2 tablets by mouth daily.     No current facility-administered medications for this encounter.    Physical Findings: The patient is in no acute distress. Patient is alert and oriented.  Somewhat hard of hearing  height is _0  (1.6 m) and weight is 136 lb 3.2 oz (61.78 kg). Her oral temperature is 98.3 F (36.8 C). Her blood pressure is 117/65 and her pulse is 104. Her respiration is 12. .  The lungs are clear. The heart has a regular rhythm and rate. The supra clavicular areas are free of adenopathy. The right axillary region demonstrates a 3.5 x 2.0 cm swelling consistent with seroma. No signs of infection in this region. The right mastectomy scar is well-healed without palpable or visible signs of recurrence.  Lab Findings: Lab Results  Component Value Date   WBC 12.5* 05/29/2015   HGB 11.7 05/29/2015   HCT 34.2* 05/29/2015   MCV 87 05/29/2015   PLT 342 05/29/2015    Radiographic Findings: [place] Extrem Up Right Ltd  05/14/2015   CLINICAL DATA:  Patient underwent a right mastectomy for breast carcinoma this year. Patient now has a swollen area in her right axilla. Patient also underwent no dissection upper right  axilla during her mastectomy.  EXAM: ULTRASOUND RIGHT UPPER EXTREMITY LIMITED  TECHNIQUE: Ultrasound examination of the upper extremity soft tissues was performed in the area of clinical concern.  COMPARISON:  Prior mammograms  FINDINGS: There is a benign fluid collection with a few internal septa but no other complicating features in the superficial right axilla, which is readily palpable. It measures 2.7 cm x 2.5 cm by 2.6 cm. There are no visible lymph nodes. There are no solid masses.  IMPRESSION: Well-defined fluid collection in the right axilla measuring 2.7 cm in greatest dimension. This is likely a postoperative seroma or collection of lymphatic  fluid. No evidence of malignancy.  BI-RADS CATEGORY:  2- benign.  RECOMMENDATION:  Cyst aspiration.   Electronically Signed   By: Lajean Manes M.D.   On: 05/14/2015 11:27   US Aspiration  05/14/2015   CLINICAL DATA:  Patient presents for aspiration of a fluid collection in her superficial right axilla. This is presumed to be a seroma or collection of lymphatic fluid in this patient to underwent right mastectomy and right axillary lymph node dissection approximately 3 months ago.  EXAM: ULTRASOUND GUIDED RIGHT AXILLARY CYST ASPIRATION  COMPARISON:  Previous exams.  PROCEDURE: Using sterile technique, 2% lidocaine, under direct ultrasound visualization, needle aspiration of the right axillary fluid collection was performed. 8 mL of slightly blood tinged fluid was recovered. This decreased the cyst size by approximately 50%. Some additional fluid was expressed out but no additional fluid could be aspirated.  IMPRESSION: Ultrasound-guided aspiration of a right axillary seroma. No apparent complications.  RECOMMENDATIONS: Yearly screening for the left breast.  Surgical consultation if the right axillary cyst re-accumulates.   Electronically Signed   By: Lajean Manes M.D.   On: 05/14/2015 11:34    Impression: Stage IIIA (T3N1M0) invasive ductal carcinoma of the right breast-ER positive/HER-2 negative. The patient is at risk for local regional recurrence given her large tumor size and lymph node positivity. I would recommend postmastectomy irradiation in this situation. I discussed the treatment course side effects and potential toxicities of radiation therapy in this situation with the patient and her daughter. The patient appears to understand.  Plan:  Simulation and planning next week with treatments to begin soon afterwards. I anticipate 25 treatments directed at the right chest wall area and high axilla/supraclavicular region. The mastectomy scar region will be boosted an additional 8  treatments.  ____________________________________ Gery Pray, MD

## 2015-06-11 NOTE — Progress Notes (Signed)
Please see the Nurse Progress Note in the MD Initial Consult Encounter for this patient. 

## 2015-06-13 DIAGNOSIS — H3531 Nonexudative age-related macular degeneration: Secondary | ICD-10-CM | POA: Diagnosis not present

## 2015-06-13 DIAGNOSIS — H43813 Vitreous degeneration, bilateral: Secondary | ICD-10-CM | POA: Diagnosis not present

## 2015-06-13 DIAGNOSIS — H3532 Exudative age-related macular degeneration: Secondary | ICD-10-CM | POA: Diagnosis not present

## 2015-06-18 ENCOUNTER — Ambulatory Visit
Admission: RE | Admit: 2015-06-18 | Discharge: 2015-06-18 | Disposition: A | Discharge status: Home / Self Care | Payer: Medicare Other | Source: Ambulatory Visit | Attending: Radiation Oncology | Admitting: Radiation Oncology

## 2015-06-18 DIAGNOSIS — Z170 Estrogen receptor positive status [ER+]: Secondary | ICD-10-CM | POA: Insufficient documentation

## 2015-06-18 DIAGNOSIS — Z510 Encounter for antineoplastic radiation therapy: Secondary | ICD-10-CM | POA: Diagnosis not present

## 2015-06-18 DIAGNOSIS — C50211 Malignant neoplasm of upper-inner quadrant of right female breast: Secondary | ICD-10-CM | POA: Diagnosis not present

## 2015-06-19 NOTE — Progress Notes (Signed)
  Radiation Oncology         (336) [phone] ________________________________  Name: Regina Cardenas MRN: [id number]  Date: 06/18/2015  DOB: 11/14/1929  SIMULATION AND TREATMENT PLANNING NOTE    ICD-9-CM ICD-10-CM   1. Breast cancer of upper-inner quadrant of right female breast 174.2 C50.211    Diagnosis: Stage IIIA (T3N1M0) invasive ductal carcinoma of the right breast-ER positive/HER-2 negative   NARRATIVE:  The patient was brought to the [facility].  Identity was confirmed.  All relevant records and images related to the planned course of therapy were reviewed.  The patient freely provided informed written consent to proceed with treatment after reviewing the details related to the planned course of therapy. The consent form was witnessed and verified by the simulation staff.  Then, the patient was set-up in a stable reproducible  supine position for radiation therapy.  CT images were obtained.  Surface markings were placed.  The CT images were loaded into the planning software.  Then the target and avoidance structures were contoured.  Treatment planning then occurred.  The radiation prescription was entered and confirmed.  Then, I designed and supervised the construction of a total of 5 medically necessary complex treatment devices.  I have requested : 3D Simulation  I have requested a DVH of the following structures: Heart, lungs, esophagus.  I have ordered:dose calc.  PLAN:  The patient will receive 45 Gy in 25 fractions using the 4 field set up encompassing the right chest wall axillary and supraclavicular region. The patient will then continue with a boost directed at the mastectomy scar continue for 14.4 gray and a cumulative dose to the right mastectomy scar region of 59.4 gray  ________________________________  -----------------------------------  Blair Promise, PhD, MD

## 2015-06-20 DIAGNOSIS — Z170 Estrogen receptor positive status [ER+]: Secondary | ICD-10-CM | POA: Diagnosis not present

## 2015-06-20 DIAGNOSIS — Z510 Encounter for antineoplastic radiation therapy: Secondary | ICD-10-CM | POA: Diagnosis not present

## 2015-06-20 DIAGNOSIS — C50211 Malignant neoplasm of upper-inner quadrant of right female breast: Secondary | ICD-10-CM | POA: Diagnosis not present

## 2015-06-24 ENCOUNTER — Other Ambulatory Visit (HOSPITAL_BASED_OUTPATIENT_CLINIC_OR_DEPARTMENT_OTHER): Payer: Medicare Other

## 2015-06-24 ENCOUNTER — Ambulatory Visit (HOSPITAL_BASED_OUTPATIENT_CLINIC_OR_DEPARTMENT_OTHER): Payer: Medicare Other | Admitting: Hematology & Oncology

## 2015-06-24 ENCOUNTER — Encounter: Payer: Self-pay | Admitting: Hematology & Oncology

## 2015-06-24 VITALS — BP 106/75 | HR 85 | Temp 98.2°F | Resp 18 | Wt 137.0 lb

## 2015-06-24 DIAGNOSIS — C50211 Malignant neoplasm of upper-inner quadrant of right female breast: Secondary | ICD-10-CM

## 2015-06-24 DIAGNOSIS — C773 Secondary and unspecified malignant neoplasm of axilla and upper limb lymph nodes: Secondary | ICD-10-CM | POA: Diagnosis not present

## 2015-06-24 DIAGNOSIS — C50411 Malignant neoplasm of upper-outer quadrant of right female breast: Secondary | ICD-10-CM

## 2015-06-24 DIAGNOSIS — R197 Diarrhea, unspecified: Secondary | ICD-10-CM

## 2015-06-24 DIAGNOSIS — Z170 Estrogen receptor positive status [ER+]: Secondary | ICD-10-CM

## 2015-06-24 DIAGNOSIS — Z510 Encounter for antineoplastic radiation therapy: Secondary | ICD-10-CM | POA: Diagnosis not present

## 2015-06-24 LAB — CBC WITH DIFFERENTIAL (CANCER CENTER ONLY)
BASO#: 0 10*3/uL (ref 0.0–0.2)
BASO%: 1 % (ref 0.0–2.0)
EOS ABS: 0.1 10*3/uL (ref 0.0–0.5)
EOS%: 2.6 % (ref 0.0–7.0)
HCT: 32.4 % — ABNORMAL LOW (ref 34.8–46.6)
HEMOGLOBIN: 10.6 g/dL — AB (ref 11.6–15.9)
LYMPH#: 0.6 10*3/uL — ABNORMAL LOW (ref 0.9–3.3)
LYMPH%: 13.3 % — ABNORMAL LOW (ref 14.0–48.0)
MCH: 31.2 pg (ref 26.0–34.0)
MCHC: 32.7 g/dL (ref 32.0–36.0)
MCV: 95 fL (ref 81–101)
MONO#: 0.4 10*3/uL (ref 0.1–0.9)
MONO%: 8.3 % (ref 0.0–13.0)
NEUT#: 3.1 10*3/uL (ref 1.5–6.5)
NEUT%: 74.8 % (ref 39.6–80.0)
Platelets: 164 10*3/uL (ref 145–400)
RBC: 3.4 10*6/uL — ABNORMAL LOW (ref 3.70–5.32)
RDW: 15.6 % (ref 11.1–15.7)
WBC: 4.2 10*3/uL (ref 3.9–10.0)

## 2015-06-24 LAB — CMP (CANCER CENTER ONLY)
ALT(SGPT): 12 U/L (ref 10–47)
AST: 23 U/L (ref 11–38)
Albumin: 2.9 g/dL — ABNORMAL LOW (ref 3.3–5.5)
Alkaline Phosphatase: 54 U/L (ref 26–84)
BUN, Bld: 12 mg/dL (ref 7–22)
CHLORIDE: 106 meq/L (ref 98–108)
CO2: 28 meq/L (ref 18–33)
Calcium: 8.9 mg/dL (ref 8.0–10.3)
Creat: 1 mg/dl (ref 0.6–1.2)
GLUCOSE: 96 mg/dL (ref 73–118)
Potassium: 3.6 mEq/L (ref 3.3–4.7)
Sodium: 139 mEq/L (ref 128–145)
Total Bilirubin: 1 mg/dl (ref 0.20–1.60)
Total Protein: 5.2 g/dL — ABNORMAL LOW (ref 6.4–8.1)

## 2015-06-24 NOTE — Progress Notes (Signed)
Hematology and Oncology Follow Up Visit  Regina Cardenas [id number] [birth year] 81 y.o. 06/24/2015   Principle Diagnosis:   Stage IIIA (T3N1M0) invasive ductal carcinoma of the right breast-ER positive/HER-2 negative  Current Therapy:    Status post Docetaxel/Cytoxan cycle #3  Neulasta 6 mg subcutaneous postchemotherapy  Patient to start radiation therapy to the right chest wall/axilla     Interim History:  Regina Cardenas is back for follow-up. She looks a whole lot better. She now has a taste back. [name] gained 8 pounds.  She will start radiation therapy later on this week.  Again, she feels a whole lot better. [name] not had problems with diarrhea. [name] had no problems with nausea or vomiting. [name] had some fatigue but this is better.  [name] had a little bit of swelling with the right hand. I told her that if necessary, we can always get her a compression sleeve to help with the lymphedema that could likely develop.  Currently, her performance status is ECOG 1-2.  .  Medications:  Current outpatient prescriptions:  .  Aflibercept (EYLEA IO), Inject into the eye. Every 4 weeks at eye MD, Disp: , Rfl:  .  aspirin 325 MG EC tablet, Take 325 mg by mouth 2 (two) times daily. , Disp: , Rfl:  .  cholecalciferol (VITAMIN D) 1000 UNITS tablet, Take 2,000 Units by mouth daily., Disp: , Rfl:  .  Multiple Vitamin (MULTIVITAMIN) tablet, Take 1 tablet by mouth daily., Disp: , Rfl:  .  Multiple Vitamins-Minerals (PRESERVISION AREDS 2 PO), Take 2 tablets by mouth daily., Disp: , Rfl:  .  potassium chloride (KLOR-CON) 8 MEQ tablet, Take 1 tablet (8 mEq total) by mouth daily., Disp: 14 tablet, Rfl: 0 .  tobramycin-dexamethasone (TOBRADEX) ophthalmic solution, Place 1 drop into the right eye every 4 (four) hours while awake. TAKES ONLY 3 DAYS A MONTH-- 2 DAYS BEFORE AND DAY OF EYE INJECTION, Disp: , Rfl:  .  metroNIDAZOLE (FLAGYL) 500 MG tablet, Take 1 tablet (500 mg total) by mouth 3 (three)  times daily. (Patient not taking: Reported on 06/11/2015), Disp: 15 tablet, Rfl: 0  Allergies: No Known Allergies  Past Medical History, Surgical history, Social history, and Family History were reviewed and updated.  Review of Systems: As above  Physical Exam:  weight is 137 lb (62.143 kg). Her oral temperature is 98.2 F (36.8 C). Her blood pressure is 106/75 and her pulse is 85. Her respiration is 18.   Wt Readings from Last 3 Encounters:  06/24/15 137 lb (62.143 kg)  06/11/15 136 lb 3.2 oz (61.78 kg)  05/29/15 129 lb (58.514 kg)     [name] in no obvious distress. Head and neck exam shows no ocular or oral lesions. She has no palpable cervical or supraclavicular lymph nodes. Thyroid is none palpable. Lungs are clear bilaterally. Cardiac exam regular rate and rhythm with no murmurs, rubs or bruits. Abdomen is soft. She has good bowel sounds. There is no fluid wave. There is no palpable hepatosplenomegaly. Breast exam shows left breast with no masses, edema or erythema. There is no left axillary adenopathy. Right chest wall shows healing mastectomy. She has no erythema or nodularity along the right chest wall.. . Extremities shows no clubbing, cyanosis or edema. There may be some slight nonpitting edema of the lower right arm. Skin exam shows no rashes, ecchymoses or petechia.  Lab Results  Component Value Date   WBC 4.2 06/24/2015   HGB 10.6* 06/24/2015  HCT 32.4* 06/24/2015   MCV 95 06/24/2015   PLT 164 06/24/2015     Chemistry      Component Value Date/Time   NA 139 06/24/2015 1201   NA 140 02/20/2015 0625   NA 142 01/30/2015 1233   K 3.6 06/24/2015 1201   K 4.5 02/20/2015 0625   K 4.4 01/30/2015 1233   CL 106 06/24/2015 1201   CL 107 02/20/2015 0625   CO2 28 06/24/2015 1201   CO2 29 02/20/2015 0625   CO2 25 01/30/2015 1233   BUN 12 06/24/2015 1201   BUN 10 02/20/2015 0625   BUN 12.6 01/30/2015 1233   CREATININE 1.0 06/24/2015 1201    CREATININE 0.81 02/20/2015 0625   CREATININE 0.8 01/30/2015 1233      Component Value Date/Time   CALCIUM 8.9 06/24/2015 1201   CALCIUM 8.7 02/20/2015 0625   CALCIUM 9.7 01/30/2015 1233   ALKPHOS 54 06/24/2015 1201   ALKPHOS 70 02/19/2015 1346   ALKPHOS 77 01/30/2015 1233   AST 23 06/24/2015 1201   AST 29 02/19/2015 1346   AST 19 01/30/2015 1233   ALT 12 06/24/2015 1201   ALT 17 02/19/2015 1346   ALT 12 01/30/2015 1233   BILITOT 1.00 06/24/2015 1201   BILITOT 1.2 02/19/2015 1346   BILITOT 0.59 01/30/2015 1233         Impression and Plan: Regina Cardenas is 81 year old postmenopausal white female. She has stage IIIa invasive ductal carcinoma of the right breast. She had a very large tumor. She had 2 lymph nodes that were positive. She had lymphovascular space invasion.  I am glad that she is doing so much better. I knew that she would get better. The fact that her weight is going out tells me that she is on the road to recovery.  I reassured her that the radiation will not affect her hair. I would think that the radiation should have very little way of side effects overall.  She will start the radiation this week.  I would like to see her back in another 4-5 weeks.   Volanda Napoleon, MD 7/18/201612:59 PM

## 2015-06-25 ENCOUNTER — Encounter (HOSPITAL_COMMUNITY): Payer: Self-pay

## 2015-06-25 ENCOUNTER — Ambulatory Visit
Admission: RE | Admit: 2015-06-25 | Discharge: 2015-06-25 | Disposition: A | Discharge status: Home / Self Care | Payer: Medicare Other | Source: Ambulatory Visit | Attending: Radiation Oncology | Admitting: Radiation Oncology

## 2015-06-25 DIAGNOSIS — C50211 Malignant neoplasm of upper-inner quadrant of right female breast: Secondary | ICD-10-CM

## 2015-06-25 DIAGNOSIS — Z170 Estrogen receptor positive status [ER+]: Secondary | ICD-10-CM | POA: Diagnosis not present

## 2015-06-25 DIAGNOSIS — Z510 Encounter for antineoplastic radiation therapy: Secondary | ICD-10-CM | POA: Diagnosis not present

## 2015-06-25 NOTE — Progress Notes (Signed)
  Radiation Oncology         [phone] ________________________________  Name: Regina Cardenas MRN: [id number]  Date: 06/25/2015  DOB: 1929-07-24  Weekly Radiation Therapy Management    ICD-9-CM ICD-10-CM   1. Breast cancer of upper-inner quadrant of right female breast 174.2 C50.211      Current Dose: 1.8 Gy     Planned Dose:  59.4 Gy  Narrative . . . . . . . . The patient presents for routine under treatment assessment.                                   The patient is without complaint.                                 Set-up films were reviewed.                                 The chart was checked. Physical Findings. . . The lungs are clear. The heart has a regular rhythm and rate. The right chest wall is well-healed. Skin dryness noted but no signs of infection or drainage. Impression . . . . . . . The patient is tolerating radiation. Plan . . . . . . . . . . . . Continue treatment as planned.  ________________________________   Blair Promise, PhD, MD

## 2015-06-25 NOTE — Progress Notes (Signed)
  Radiation Oncology         (336) [phone] ________________________________  Name: Regina Cardenas MRN: [id number]  Date: 06/25/2015  DOB: [birth year]  Simulation Verification Note    ICD-9-CM ICD-10-CM   1. Breast cancer of upper-inner quadrant of right female breast 174.2 C50.211     Status: outpatient  NARRATIVE: The patient was brought to the treatment unit and placed in the planned treatment position. The clinical setup was verified. Then port films were obtained and uploaded to the radiation oncology medical record software.  The treatment beams were carefully compared against the planned radiation fields. The position location and shape of the radiation fields was reviewed. They targeted volume of tissue appears to be appropriately covered by the radiation beams. Organs at risk appear to be excluded as planned.  Based on my personal review, I approved the simulation verification. The patient's treatment will proceed as planned.  -----------------------------------  Blair Promise, PhD, MD

## 2015-06-26 ENCOUNTER — Ambulatory Visit
Admission: RE | Admit: 2015-06-26 | Discharge: 2015-06-26 | Disposition: A | Discharge status: Home / Self Care | Payer: Medicare Other | Source: Ambulatory Visit | Attending: Radiation Oncology | Admitting: Radiation Oncology

## 2015-06-26 DIAGNOSIS — Z510 Encounter for antineoplastic radiation therapy: Secondary | ICD-10-CM | POA: Diagnosis not present

## 2015-06-26 DIAGNOSIS — C50211 Malignant neoplasm of upper-inner quadrant of right female breast: Secondary | ICD-10-CM | POA: Diagnosis not present

## 2015-06-26 DIAGNOSIS — Z170 Estrogen receptor positive status [ER+]: Secondary | ICD-10-CM | POA: Diagnosis not present

## 2015-06-27 ENCOUNTER — Ambulatory Visit
Admission: RE | Admit: 2015-06-27 | Discharge: 2015-06-27 | Disposition: A | Discharge status: Home / Self Care | Payer: Medicare Other | Source: Ambulatory Visit | Attending: Radiation Oncology | Admitting: Radiation Oncology

## 2015-06-27 DIAGNOSIS — Z510 Encounter for antineoplastic radiation therapy: Secondary | ICD-10-CM | POA: Diagnosis not present

## 2015-06-27 DIAGNOSIS — C50211 Malignant neoplasm of upper-inner quadrant of right female breast: Secondary | ICD-10-CM | POA: Diagnosis not present

## 2015-06-27 DIAGNOSIS — Z170 Estrogen receptor positive status [ER+]: Secondary | ICD-10-CM | POA: Diagnosis not present

## 2015-06-28 ENCOUNTER — Ambulatory Visit
Admission: RE | Admit: 2015-06-28 | Discharge: 2015-06-28 | Disposition: A | Discharge status: Home / Self Care | Payer: Medicare Other | Source: Ambulatory Visit | Attending: Radiation Oncology | Admitting: Radiation Oncology

## 2015-06-28 DIAGNOSIS — C50911 Malignant neoplasm of unspecified site of right female breast: Secondary | ICD-10-CM | POA: Insufficient documentation

## 2015-06-28 DIAGNOSIS — C50211 Malignant neoplasm of upper-inner quadrant of right female breast: Secondary | ICD-10-CM | POA: Diagnosis not present

## 2015-06-28 DIAGNOSIS — Z170 Estrogen receptor positive status [ER+]: Secondary | ICD-10-CM | POA: Diagnosis not present

## 2015-06-28 DIAGNOSIS — Z510 Encounter for antineoplastic radiation therapy: Secondary | ICD-10-CM | POA: Diagnosis not present

## 2015-06-28 MED ORDER — RADIAPLEXRX EX GEL
Freq: Once | CUTANEOUS | Status: AC
  Administered 2015-06-28: 18:00:00 via TOPICAL

## 2015-06-28 MED ORDER — ALRA NON-METALLIC DEODORANT (RAD-ONC)
1.0000 "application " | Freq: Once | TOPICAL | Status: AC
  Administered 2015-06-28: 1 via TOPICAL

## 2015-06-28 NOTE — Progress Notes (Signed)
Pt here for patient teaching.  Pt given Radiation and You booklet, skin care instructions, Alra deodorant and Radiaplex gel.  Reviewed areas of pertinence such as fatigue and skin changes . Pt able to give teach back of to pat skin and use unscented/gentle soap,apply Radiaplex bid, avoid applying anything to skin within 4 hours of treatment and avoid wearing an under wire bra. Pt demonstrated understanding and verbalizes understanding of information given and will contact nursing with any questions or concerns.          

## 2015-06-30 ENCOUNTER — Ambulatory Visit: Payer: Medicare Other

## 2015-07-01 ENCOUNTER — Ambulatory Visit
Admission: RE | Admit: 2015-07-01 | Discharge: 2015-07-01 | Disposition: A | Discharge status: Home / Self Care | Payer: Medicare Other | Source: Ambulatory Visit | Attending: Radiation Oncology | Admitting: Radiation Oncology

## 2015-07-01 ENCOUNTER — Ambulatory Visit: Payer: Medicare Other

## 2015-07-01 DIAGNOSIS — C50211 Malignant neoplasm of upper-inner quadrant of right female breast: Secondary | ICD-10-CM | POA: Diagnosis not present

## 2015-07-01 DIAGNOSIS — Z510 Encounter for antineoplastic radiation therapy: Secondary | ICD-10-CM | POA: Diagnosis not present

## 2015-07-01 DIAGNOSIS — Z170 Estrogen receptor positive status [ER+]: Secondary | ICD-10-CM | POA: Diagnosis not present

## 2015-07-02 ENCOUNTER — Ambulatory Visit
Admission: RE | Admit: 2015-07-02 | Discharge: 2015-07-02 | Disposition: A | Discharge status: Home / Self Care | Payer: Medicare Other | Source: Ambulatory Visit | Attending: Radiation Oncology | Admitting: Radiation Oncology

## 2015-07-02 ENCOUNTER — Encounter: Payer: Self-pay | Admitting: Radiation Oncology

## 2015-07-02 ENCOUNTER — Ambulatory Visit: Payer: Medicare Other

## 2015-07-02 VITALS — BP 126/60 | HR 72 | Temp 98.0°F | Resp 20 | Wt 142.0 lb

## 2015-07-02 DIAGNOSIS — Z510 Encounter for antineoplastic radiation therapy: Secondary | ICD-10-CM | POA: Diagnosis not present

## 2015-07-02 DIAGNOSIS — Z170 Estrogen receptor positive status [ER+]: Secondary | ICD-10-CM | POA: Diagnosis not present

## 2015-07-02 DIAGNOSIS — C50211 Malignant neoplasm of upper-inner quadrant of right female breast: Secondary | ICD-10-CM | POA: Diagnosis not present

## 2015-07-02 NOTE — Progress Notes (Signed)
  Radiation Oncology         (336) [phone] ________________________________  Name: Regina Cardenas MRN: [id number]  Date: 07/02/2015  DOB: 1929/04/20  Weekly Radiation Therapy Management    ICD-9-CM ICD-10-CM  1. Breast cancer of upper-inner quadrant of right female breast 174.2 C50.211    Current Dose: 9 Gy     Planned Dose:  59.4 Gy  Narrative . . . . . . . . The patient presents for routine under treatment assessment.                                   The patient is without complaint. Slight erythema, dry skin right chest wall, started using radiplex yesterday. Has some swelling in arm right below elbow, and discomfort in arm, numbness under axilla. Appetite good, taste buds returned stated, energy level normal stated. She has not been in physical therapy.                                  Set-up films were reviewed.                                 The chart was checked. Physical Findings. . . The lungs are clear. The heart has a regular rhythm and rate. The right chest wall is well-healed. Skin dryness noted but no signs of infection or drainage.  Mild swelling noted in the right arm. No palpable cords Impression . . . . . . . The patient is tolerating radiation. Plan . . . . . . . . . . . . Continue treatment as planned.   This document serves as a record of services personally performed by Gery Pray, MD. It was created on his behalf by Arlyce Harman, a trained medical scribe. The creation of this record is based on the scribe's personal observations and the provider's statements to them. This document has been checked and approved by the attending provider. ________________________________   Blair Promise, PhD, MD

## 2015-07-02 NOTE — Progress Notes (Signed)
Weekly.

## 2015-07-02 NOTE — Progress Notes (Signed)
Weekly rad txs right Chest wall,  5/25 completed slight erythma, dry skin right chest wall,strted using radiplex yesterday, has some swelling in arm right below elbow, and discomfort in arm, numbness undr axilla, appetie good, taste buds returned stated, energy level normal stated  12:51 PM BP 126/60 mmHg  Pulse 72  Temp(Src) 98 F (36.7 C) (Oral)  Resp 20  Wt 142 lb (64.411 kg)  Wt Readings from Last 3 Encounters:  07/02/15 142 lb (64.411 kg)  06/24/15 137 lb (62.143 kg)  06/11/15 136 lb 3.2 oz (61.78 kg)

## 2015-07-03 ENCOUNTER — Ambulatory Visit
Admission: RE | Admit: 2015-07-03 | Discharge: 2015-07-03 | Disposition: A | Discharge status: Home / Self Care | Payer: Medicare Other | Source: Ambulatory Visit | Attending: Radiation Oncology | Admitting: Radiation Oncology

## 2015-07-03 DIAGNOSIS — C50211 Malignant neoplasm of upper-inner quadrant of right female breast: Secondary | ICD-10-CM | POA: Diagnosis not present

## 2015-07-03 DIAGNOSIS — Z510 Encounter for antineoplastic radiation therapy: Secondary | ICD-10-CM | POA: Diagnosis not present

## 2015-07-03 DIAGNOSIS — Z170 Estrogen receptor positive status [ER+]: Secondary | ICD-10-CM | POA: Diagnosis not present

## 2015-07-04 ENCOUNTER — Ambulatory Visit: Payer: Medicare Other

## 2015-07-04 ENCOUNTER — Ambulatory Visit
Admission: RE | Admit: 2015-07-04 | Discharge: 2015-07-04 | Disposition: A | Discharge status: Home / Self Care | Payer: Medicare Other | Source: Ambulatory Visit | Attending: Radiation Oncology | Admitting: Radiation Oncology

## 2015-07-04 DIAGNOSIS — C50211 Malignant neoplasm of upper-inner quadrant of right female breast: Secondary | ICD-10-CM | POA: Diagnosis not present

## 2015-07-04 DIAGNOSIS — Z510 Encounter for antineoplastic radiation therapy: Secondary | ICD-10-CM | POA: Diagnosis not present

## 2015-07-04 DIAGNOSIS — Z170 Estrogen receptor positive status [ER+]: Secondary | ICD-10-CM | POA: Diagnosis not present

## 2015-07-05 ENCOUNTER — Ambulatory Visit
Admission: RE | Admit: 2015-07-05 | Discharge: 2015-07-05 | Disposition: A | Discharge status: Home / Self Care | Payer: Medicare Other | Source: Ambulatory Visit | Attending: Radiation Oncology | Admitting: Radiation Oncology

## 2015-07-05 DIAGNOSIS — C50211 Malignant neoplasm of upper-inner quadrant of right female breast: Secondary | ICD-10-CM | POA: Diagnosis not present

## 2015-07-05 DIAGNOSIS — Z510 Encounter for antineoplastic radiation therapy: Secondary | ICD-10-CM | POA: Diagnosis not present

## 2015-07-05 DIAGNOSIS — Z170 Estrogen receptor positive status [ER+]: Secondary | ICD-10-CM | POA: Diagnosis not present

## 2015-07-08 ENCOUNTER — Ambulatory Visit
Admission: RE | Admit: 2015-07-08 | Discharge: 2015-07-08 | Disposition: A | Discharge status: Home / Self Care | Payer: Medicare Other | Source: Ambulatory Visit | Attending: Radiation Oncology | Admitting: Radiation Oncology

## 2015-07-08 DIAGNOSIS — C50211 Malignant neoplasm of upper-inner quadrant of right female breast: Secondary | ICD-10-CM | POA: Diagnosis not present

## 2015-07-08 DIAGNOSIS — Z510 Encounter for antineoplastic radiation therapy: Secondary | ICD-10-CM | POA: Diagnosis not present

## 2015-07-08 DIAGNOSIS — Z170 Estrogen receptor positive status [ER+]: Secondary | ICD-10-CM | POA: Diagnosis not present

## 2015-07-09 ENCOUNTER — Ambulatory Visit
Admission: RE | Admit: 2015-07-09 | Discharge: 2015-07-09 | Disposition: A | Discharge status: Home / Self Care | Payer: Medicare Other | Source: Ambulatory Visit | Attending: Radiation Oncology | Admitting: Radiation Oncology

## 2015-07-09 ENCOUNTER — Encounter: Payer: Self-pay | Admitting: Radiation Oncology

## 2015-07-09 VITALS — BP 131/68 | HR 70 | Temp 98.3°F | Resp 16 | Ht 63.0 in | Wt 142.9 lb

## 2015-07-09 DIAGNOSIS — Z170 Estrogen receptor positive status [ER+]: Secondary | ICD-10-CM | POA: Diagnosis not present

## 2015-07-09 DIAGNOSIS — C50211 Malignant neoplasm of upper-inner quadrant of right female breast: Secondary | ICD-10-CM | POA: Diagnosis not present

## 2015-07-09 DIAGNOSIS — Z510 Encounter for antineoplastic radiation therapy: Secondary | ICD-10-CM | POA: Diagnosis not present

## 2015-07-09 NOTE — Progress Notes (Signed)
Weekly Management Note Current Dose:  19.86 Gy  Projected Dose:  45 Gy   Narrative:  The patient presents for routine under treatment assessment.  CBCT/MVCT images/Port film x-rays were reviewed.  The chart was checked. Pt denies pain. She complains of having skin folds on the lateral and medial aspect of her scar that interferes with movement.  Physical Findings: Weight: 142 lb 14.4 oz (64.819 kg).  Impression:  The patient is tolerating radiation. She minimal pink skin. She has typical skin folds on her lateral and medial aspects of her scar.  Plan:  Continue treatment as planned. Continue radiaplex. I discussed with her that these skin areas might flatten out after radiation or she may require surgical intervention.  This document serves as a record of services personally performed by Thea Silversmith, MD. It was created on her behalf by Darcus Austin, a trained medical scribe. The creation of this record is based on the scribe's personal observations and the provider's statements to them. This document has been checked and approved by the attending provider.

## 2015-07-09 NOTE — Progress Notes (Signed)
Regina Cardenas has completed 10 fractions to her right chest.  She denies pain and fatigue.  The skin on her right chest is slightly red.  Her skin is dry on her upper right chest and upper back.  She is using radiaplex twice a day  BP 131/68 mmHg  Pulse 70  Temp(Src) 98.3 F (36.8 C) (Oral)  Resp 16  Ht 5\' 3"  (1.6 m)  Wt 142 lb 14.4 oz (64.819 kg)  BMI 25.32 kg/m2

## 2015-07-10 ENCOUNTER — Ambulatory Visit
Admission: RE | Admit: 2015-07-10 | Discharge: 2015-07-10 | Disposition: A | Discharge status: Home / Self Care | Payer: Medicare Other | Source: Ambulatory Visit | Attending: Radiation Oncology | Admitting: Radiation Oncology

## 2015-07-10 DIAGNOSIS — Z170 Estrogen receptor positive status [ER+]: Secondary | ICD-10-CM | POA: Diagnosis not present

## 2015-07-10 DIAGNOSIS — Z510 Encounter for antineoplastic radiation therapy: Secondary | ICD-10-CM | POA: Diagnosis not present

## 2015-07-10 DIAGNOSIS — C50211 Malignant neoplasm of upper-inner quadrant of right female breast: Secondary | ICD-10-CM | POA: Diagnosis not present

## 2015-07-11 ENCOUNTER — Ambulatory Visit
Admission: RE | Admit: 2015-07-11 | Discharge: 2015-07-11 | Disposition: A | Discharge status: Home / Self Care | Payer: Medicare Other | Source: Ambulatory Visit | Attending: Radiation Oncology | Admitting: Radiation Oncology

## 2015-07-11 DIAGNOSIS — Z510 Encounter for antineoplastic radiation therapy: Secondary | ICD-10-CM | POA: Diagnosis not present

## 2015-07-11 DIAGNOSIS — Z170 Estrogen receptor positive status [ER+]: Secondary | ICD-10-CM | POA: Diagnosis not present

## 2015-07-11 DIAGNOSIS — C50211 Malignant neoplasm of upper-inner quadrant of right female breast: Secondary | ICD-10-CM | POA: Diagnosis not present

## 2015-07-12 ENCOUNTER — Ambulatory Visit: Payer: Medicare Other

## 2015-07-12 ENCOUNTER — Ambulatory Visit
Admission: RE | Admit: 2015-07-12 | Discharge: 2015-07-12 | Disposition: A | Discharge status: Home / Self Care | Payer: Medicare Other | Source: Ambulatory Visit | Attending: Radiation Oncology | Admitting: Radiation Oncology

## 2015-07-12 DIAGNOSIS — Z170 Estrogen receptor positive status [ER+]: Secondary | ICD-10-CM | POA: Diagnosis not present

## 2015-07-12 DIAGNOSIS — Z510 Encounter for antineoplastic radiation therapy: Secondary | ICD-10-CM | POA: Diagnosis not present

## 2015-07-12 DIAGNOSIS — H3531 Nonexudative age-related macular degeneration: Secondary | ICD-10-CM | POA: Diagnosis not present

## 2015-07-12 DIAGNOSIS — C50211 Malignant neoplasm of upper-inner quadrant of right female breast: Secondary | ICD-10-CM | POA: Diagnosis not present

## 2015-07-12 DIAGNOSIS — H3532 Exudative age-related macular degeneration: Secondary | ICD-10-CM | POA: Diagnosis not present

## 2015-07-12 DIAGNOSIS — H43813 Vitreous degeneration, bilateral: Secondary | ICD-10-CM | POA: Diagnosis not present

## 2015-07-15 ENCOUNTER — Ambulatory Visit
Admission: RE | Admit: 2015-07-15 | Discharge: 2015-07-15 | Disposition: A | Discharge status: Home / Self Care | Payer: Medicare Other | Source: Ambulatory Visit | Attending: Radiation Oncology | Admitting: Radiation Oncology

## 2015-07-15 DIAGNOSIS — Z170 Estrogen receptor positive status [ER+]: Secondary | ICD-10-CM | POA: Diagnosis not present

## 2015-07-15 DIAGNOSIS — Z510 Encounter for antineoplastic radiation therapy: Secondary | ICD-10-CM | POA: Diagnosis not present

## 2015-07-15 DIAGNOSIS — C50211 Malignant neoplasm of upper-inner quadrant of right female breast: Secondary | ICD-10-CM | POA: Diagnosis not present

## 2015-07-16 ENCOUNTER — Ambulatory Visit
Admission: RE | Admit: 2015-07-16 | Discharge: 2015-07-16 | Disposition: A | Discharge status: Home / Self Care | Payer: Medicare Other | Source: Ambulatory Visit | Attending: Radiation Oncology | Admitting: Radiation Oncology

## 2015-07-16 ENCOUNTER — Encounter: Payer: Self-pay | Admitting: Radiation Oncology

## 2015-07-16 VITALS — BP 119/77 | HR 67 | Temp 97.7°F | Resp 16 | Ht 63.0 in | Wt 139.3 lb

## 2015-07-16 DIAGNOSIS — C50211 Malignant neoplasm of upper-inner quadrant of right female breast: Secondary | ICD-10-CM

## 2015-07-16 DIAGNOSIS — Z170 Estrogen receptor positive status [ER+]: Secondary | ICD-10-CM | POA: Diagnosis not present

## 2015-07-16 DIAGNOSIS — Z510 Encounter for antineoplastic radiation therapy: Secondary | ICD-10-CM | POA: Diagnosis not present

## 2015-07-16 NOTE — Progress Notes (Signed)
  Radiation Oncology         (336) [phone] ________________________________  Name: Regina Cardenas MRN: [id number]  Date: 07/16/2015  DOB: 01-May-1929  Weekly Radiation Therapy Management    ICD-9-CM ICD-10-CM   1. Breast cancer of upper-inner quadrant of right female breast 174.2 C50.211     Current Dose: 27 Gy     Planned Dose:  59.4 Gy  Narrative . . . . . . . . The patient presents for routine under treatment assessment.                                   The patient is without complaint. Mild itching noted in the right chest area                                 Set-up films were reviewed.                                 The chart was checked. Physical Findings. . .  height is 5\' 3"  (1.6 m) and weight is 139 lb 4.8 oz (63.186 kg). Her oral temperature is 97.7 F (36.5 C). Her blood pressure is 119/77 and her pulse is 67. Her respiration is 16. . Weight essentially stable. The lungs are clear. The heart has regular rhythm and rate. The right chest wall area shows some mild erythema and hyperpigmentation changes. No moist desquamation. Impression . . . . . . . The patient is tolerating radiation. Plan . . . . . . . . . . . . Continue treatment as planned.  ________________________________   Blair Promise, PhD, MD

## 2015-07-16 NOTE — Progress Notes (Signed)
Regina Cardenas has completed 15 fractions to her right chest.  She denies pain and fatigue.  The skin on her right chest is dry and red.  She reports that it is itchy.  She has been using radiaplex once a day.  Advised her to start using it twice a day.  Also recommended biafine cream but she would like to wait until she has used up the radiaplex.  BP 119/77 mmHg  Pulse 67  Temp(Src) 97.7 F (36.5 C) (Oral)  Resp 16  Ht 5\' 3"  (1.6 m)  Wt 139 lb 4.8 oz (63.186 kg)  BMI 24.68 kg/m2

## 2015-07-17 ENCOUNTER — Ambulatory Visit
Admission: RE | Admit: 2015-07-17 | Discharge: 2015-07-17 | Disposition: A | Discharge status: Home / Self Care | Payer: Medicare Other | Source: Ambulatory Visit | Attending: Radiation Oncology | Admitting: Radiation Oncology

## 2015-07-17 DIAGNOSIS — C50211 Malignant neoplasm of upper-inner quadrant of right female breast: Secondary | ICD-10-CM | POA: Diagnosis not present

## 2015-07-17 DIAGNOSIS — Z170 Estrogen receptor positive status [ER+]: Secondary | ICD-10-CM | POA: Diagnosis not present

## 2015-07-17 DIAGNOSIS — Z510 Encounter for antineoplastic radiation therapy: Secondary | ICD-10-CM | POA: Diagnosis not present

## 2015-07-18 ENCOUNTER — Ambulatory Visit
Admission: RE | Admit: 2015-07-18 | Discharge: 2015-07-18 | Disposition: A | Discharge status: Home / Self Care | Payer: Medicare Other | Source: Ambulatory Visit | Attending: Radiation Oncology | Admitting: Radiation Oncology

## 2015-07-18 DIAGNOSIS — Z170 Estrogen receptor positive status [ER+]: Secondary | ICD-10-CM | POA: Diagnosis not present

## 2015-07-18 DIAGNOSIS — Z510 Encounter for antineoplastic radiation therapy: Secondary | ICD-10-CM | POA: Diagnosis not present

## 2015-07-18 DIAGNOSIS — C50211 Malignant neoplasm of upper-inner quadrant of right female breast: Secondary | ICD-10-CM | POA: Diagnosis not present

## 2015-07-19 ENCOUNTER — Ambulatory Visit
Admission: RE | Admit: 2015-07-19 | Discharge: 2015-07-19 | Disposition: A | Discharge status: Home / Self Care | Payer: Medicare Other | Source: Ambulatory Visit | Attending: Radiation Oncology | Admitting: Radiation Oncology

## 2015-07-19 DIAGNOSIS — Z510 Encounter for antineoplastic radiation therapy: Secondary | ICD-10-CM | POA: Diagnosis not present

## 2015-07-19 DIAGNOSIS — C50211 Malignant neoplasm of upper-inner quadrant of right female breast: Secondary | ICD-10-CM | POA: Diagnosis not present

## 2015-07-19 DIAGNOSIS — Z170 Estrogen receptor positive status [ER+]: Secondary | ICD-10-CM | POA: Diagnosis not present

## 2015-07-22 ENCOUNTER — Ambulatory Visit
Admission: RE | Admit: 2015-07-22 | Discharge: 2015-07-22 | Disposition: A | Discharge status: Home / Self Care | Payer: Medicare Other | Source: Ambulatory Visit | Attending: Radiation Oncology | Admitting: Radiation Oncology

## 2015-07-22 DIAGNOSIS — Z510 Encounter for antineoplastic radiation therapy: Secondary | ICD-10-CM | POA: Diagnosis not present

## 2015-07-22 DIAGNOSIS — C50211 Malignant neoplasm of upper-inner quadrant of right female breast: Secondary | ICD-10-CM | POA: Diagnosis not present

## 2015-07-22 DIAGNOSIS — Z170 Estrogen receptor positive status [ER+]: Secondary | ICD-10-CM | POA: Diagnosis not present

## 2015-07-23 ENCOUNTER — Ambulatory Visit
Admission: RE | Admit: 2015-07-23 | Discharge: 2015-07-23 | Disposition: A | Discharge status: Home / Self Care | Payer: Medicare Other | Source: Ambulatory Visit | Attending: Radiation Oncology | Admitting: Radiation Oncology

## 2015-07-23 ENCOUNTER — Ambulatory Visit: Payer: Medicare Other | Admitting: Radiation Oncology

## 2015-07-23 VITALS — BP 144/78 | HR 79 | Temp 98.3°F | Resp 16 | Ht 63.0 in | Wt 139.9 lb

## 2015-07-23 DIAGNOSIS — C50211 Malignant neoplasm of upper-inner quadrant of right female breast: Secondary | ICD-10-CM | POA: Diagnosis not present

## 2015-07-23 DIAGNOSIS — Z510 Encounter for antineoplastic radiation therapy: Secondary | ICD-10-CM | POA: Diagnosis not present

## 2015-07-23 DIAGNOSIS — Z170 Estrogen receptor positive status [ER+]: Secondary | ICD-10-CM | POA: Diagnosis not present

## 2015-07-23 MED ORDER — BIAFINE EX EMUL
Freq: Once | CUTANEOUS | Status: AC
  Administered 2015-07-23: 15:00:00 via TOPICAL

## 2015-07-23 NOTE — Progress Notes (Signed)
  Radiation Oncology         [phone] ________________________________  Name: Regina Cardenas MRN: [id number]  Date: 07/23/2015  DOB: 05-31-1929  Weekly Radiation Therapy Management  Diagnosis: Stage IIIA (T3N1M0) invasive ductal carcinoma of the right breast-ER positive/HER-2 negative  Current Dose: 36 Gy     Planned Dose:  59.4 Gy  Narrative . . . . . . . . The patient presents for routine under treatment assessment.                                  She has completed 20 fractions to her right chest wall. She denies pain. She reports slight fatigue. The skin on right chest and upper back is red with small scabbed areas. She reports her right back and central chest area is itching. She had been using radiaplex and will be given a tube of biafine to try.                                 Set-up films were reviewed.                                 The chart was checked. Physical Findings. . .  height is $RemoveB'5\' 3"'ilAEfrZb$  (1.6 m) and weight is 139 lb 14.4 oz (63.458 kg). Her oral temperature is 98.3 F (36.8 C). Her blood pressure is 144/78 and her pulse is 79. Her respiration is 16. . Weight essentially stable.  No significant changes. The skin on right chest and upper back is red with small scabbed areas.No moist desquamation. The lungs are clear to auscultation. The heart has a regular rhythm and rate. Impression . . . . . . . The patient is tolerating radiation. Plan . . . . . . . . . . . . Continue treatment as planned.  ________________________________   Blair Promise, PhD, MD

## 2015-07-23 NOTE — Progress Notes (Signed)
Regina Cardenas has completed 20 fractions to her right chest wall.  She denies pain.  She reports slight fatigue.  The skin on right chest and upper back is red with small scabbed areas.  She reports her right back and central chest area is itching.  She had been using radiaplex and will be given a tube of biafine to try.  BP 144/78 mmHg  Pulse 79  Temp(Src) 98.3 F (36.8 C) (Oral)  Resp 16  Ht 5\' 3"  (1.6 m)  Wt 139 lb 14.4 oz (63.458 kg)  BMI 24.79 kg/m2

## 2015-07-23 NOTE — Progress Notes (Signed)
  Radiation Oncology         [phone] ________________________________  Name: Regina Cardenas MRN: [id number]  Date: 07/23/2015  DOB: [birth year]  Electron Beam Simulation  Note    ICD-9-CM ICD-10-CM   1. Breast cancer of upper-inner quadrant of right female breast 174.2 C50.211     Status: outpatient  NARRATIVE: Today the patient underwent additional planning for radiation therapy directed at the right chest wall area. The patient was placed in the treatment position on the linear accelerator. Her mastectomy scar was identified. The patient then had set up of a custom electron cutout field encompassing the target area. Patient will be treated with 6 or 9 Me V electrons. In addition bolus will be used to improve the coverage along the skin.  Plan: The patient will receive 8 additional treatments at 1.8 gray per treatment for a boost dose of 14.4 Gy and a cumulative dose of 59.4 gray to the right chest wall area. -----------------------------------  Blair Promise, PhD, MD

## 2015-07-24 ENCOUNTER — Ambulatory Visit: Payer: Medicare Other | Admitting: Radiation Oncology

## 2015-07-24 ENCOUNTER — Ambulatory Visit
Admission: RE | Admit: 2015-07-24 | Discharge: 2015-07-24 | Disposition: A | Discharge status: Home / Self Care | Payer: Medicare Other | Source: Ambulatory Visit | Attending: Radiation Oncology | Admitting: Radiation Oncology

## 2015-07-24 ENCOUNTER — Encounter: Payer: Self-pay | Admitting: Radiation Oncology

## 2015-07-24 DIAGNOSIS — Z170 Estrogen receptor positive status [ER+]: Secondary | ICD-10-CM | POA: Diagnosis not present

## 2015-07-24 DIAGNOSIS — C50211 Malignant neoplasm of upper-inner quadrant of right female breast: Secondary | ICD-10-CM | POA: Diagnosis not present

## 2015-07-24 DIAGNOSIS — Z510 Encounter for antineoplastic radiation therapy: Secondary | ICD-10-CM | POA: Diagnosis not present

## 2015-07-25 ENCOUNTER — Ambulatory Visit: Payer: Medicare Other | Admitting: Radiation Oncology

## 2015-07-25 ENCOUNTER — Ambulatory Visit
Admission: RE | Admit: 2015-07-25 | Discharge: 2015-07-25 | Disposition: A | Discharge status: Home / Self Care | Payer: Medicare Other | Source: Ambulatory Visit | Attending: Radiation Oncology | Admitting: Radiation Oncology

## 2015-07-25 DIAGNOSIS — C50211 Malignant neoplasm of upper-inner quadrant of right female breast: Secondary | ICD-10-CM | POA: Diagnosis not present

## 2015-07-25 DIAGNOSIS — Z170 Estrogen receptor positive status [ER+]: Secondary | ICD-10-CM | POA: Diagnosis not present

## 2015-07-25 DIAGNOSIS — Z510 Encounter for antineoplastic radiation therapy: Secondary | ICD-10-CM | POA: Diagnosis not present

## 2015-07-26 ENCOUNTER — Ambulatory Visit
Admission: RE | Admit: 2015-07-26 | Discharge: 2015-07-26 | Disposition: A | Discharge status: Home / Self Care | Payer: Medicare Other | Source: Ambulatory Visit | Attending: Radiation Oncology | Admitting: Radiation Oncology

## 2015-07-26 ENCOUNTER — Encounter: Payer: Self-pay | Admitting: Hematology & Oncology

## 2015-07-26 ENCOUNTER — Other Ambulatory Visit (HOSPITAL_BASED_OUTPATIENT_CLINIC_OR_DEPARTMENT_OTHER): Payer: Medicare Other

## 2015-07-26 ENCOUNTER — Ambulatory Visit (HOSPITAL_BASED_OUTPATIENT_CLINIC_OR_DEPARTMENT_OTHER): Payer: Medicare Other | Admitting: Hematology & Oncology

## 2015-07-26 VITALS — BP 139/74 | HR 86 | Temp 97.8°F | Resp 16 | Ht 63.0 in | Wt 140.0 lb

## 2015-07-26 DIAGNOSIS — Z170 Estrogen receptor positive status [ER+]: Secondary | ICD-10-CM

## 2015-07-26 DIAGNOSIS — C50211 Malignant neoplasm of upper-inner quadrant of right female breast: Secondary | ICD-10-CM

## 2015-07-26 DIAGNOSIS — C50411 Malignant neoplasm of upper-outer quadrant of right female breast: Secondary | ICD-10-CM

## 2015-07-26 DIAGNOSIS — C773 Secondary and unspecified malignant neoplasm of axilla and upper limb lymph nodes: Secondary | ICD-10-CM | POA: Diagnosis not present

## 2015-07-26 DIAGNOSIS — E559 Vitamin D deficiency, unspecified: Secondary | ICD-10-CM

## 2015-07-26 DIAGNOSIS — Z510 Encounter for antineoplastic radiation therapy: Secondary | ICD-10-CM | POA: Diagnosis not present

## 2015-07-26 LAB — CBC WITH DIFFERENTIAL (CANCER CENTER ONLY)
BASO#: 0.1 10*3/uL (ref 0.0–0.2)
BASO%: 1.2 % (ref 0.0–2.0)
EOS%: 2.7 % (ref 0.0–7.0)
Eosinophils Absolute: 0.1 10*3/uL (ref 0.0–0.5)
HCT: 34.6 % — ABNORMAL LOW (ref 34.8–46.6)
HEMOGLOBIN: 11.3 g/dL — AB (ref 11.6–15.9)
LYMPH#: 0.3 10*3/uL — AB (ref 0.9–3.3)
LYMPH%: 7.1 % — ABNORMAL LOW (ref 14.0–48.0)
MCH: 30.7 pg (ref 26.0–34.0)
MCHC: 32.7 g/dL (ref 32.0–36.0)
MCV: 94 fL (ref 81–101)
MONO#: 0.4 10*3/uL (ref 0.1–0.9)
MONO%: 10.3 % (ref 0.0–13.0)
NEUT#: 3.2 10*3/uL (ref 1.5–6.5)
NEUT%: 78.7 % (ref 39.6–80.0)
Platelets: 146 10*3/uL (ref 145–400)
RBC: 3.68 10*6/uL — ABNORMAL LOW (ref 3.70–5.32)
RDW: 13 % (ref 11.1–15.7)
WBC: 4.1 10*3/uL (ref 3.9–10.0)

## 2015-07-26 LAB — COMPREHENSIVE METABOLIC PANEL (CC13)
ALBUMIN: 3.2 g/dL — AB (ref 3.5–5.0)
ALT: 13 U/L (ref 0–55)
ANION GAP: 8 meq/L (ref 3–11)
AST: 23 U/L (ref 5–34)
Alkaline Phosphatase: 68 U/L (ref 40–150)
BILIRUBIN TOTAL: 0.44 mg/dL (ref 0.20–1.20)
BUN: 10.1 mg/dL (ref 7.0–26.0)
CALCIUM: 9.3 mg/dL (ref 8.4–10.4)
CHLORIDE: 106 meq/L (ref 98–109)
CO2: 28 mEq/L (ref 22–29)
CREATININE: 0.7 mg/dL (ref 0.6–1.1)
EGFR: 74 mL/min/{1.73_m2} — ABNORMAL LOW (ref >90)
Glucose: 80 mg/dl (ref 70–140)
Potassium: 4.2 mEq/L (ref 3.5–5.1)
Sodium: 142 mEq/L (ref 136–145)
TOTAL PROTEIN: 5.4 g/dL — AB (ref 6.4–8.3)

## 2015-07-26 NOTE — Progress Notes (Signed)
Hematology and Oncology Follow Up Visit  Regina Cardenas [id number] 09/25/1929 81 y.o. 07/26/2015   Principle Diagnosis:   Stage IIIA (T3N1M0) invasive ductal carcinoma of the right breast-ER positive/HER-2 negative  Current Therapy:    Status post Docetaxel/Cytoxan cycle #3  Neulasta 6 mg subcutaneous postchemotherapy  Radiation therapy to the right chest wall/axilla     Interim History:  Regina Cardenas is back for follow-up. She continues to improve. The radiation is going well. She has had about three fourths of her radiation. Her skin has done well with radiation.  She has not had any problems with her appetite. She is eating better.  There is no problems with bowels or bladder.  There is no arm or leg swelling. She has had no rashes.  [name] had no fever.  This been no cough or shortness of breath.  Currently, her performance status is ECOG 1 .  Medications:  Current outpatient prescriptions:  .  Aflibercept (EYLEA IO), Inject into the eye. Every 4 weeks at eye MD, Disp: , Rfl:  .  aspirin 325 MG EC tablet, Take 325 mg by mouth 2 (two) times daily. , Disp: , Rfl:  .  cholecalciferol (VITAMIN D) 1000 UNITS tablet, Take 2,000 Units by mouth daily., Disp: , Rfl:  .  emollient (BIAFINE) cream, Apply topically 2 (two) times daily., Disp: , Rfl:  .  Multiple Vitamin (MULTIVITAMIN) tablet, Take 1 tablet by mouth daily., Disp: , Rfl:  .  Multiple Vitamins-Minerals (PRESERVISION AREDS 2 PO), Take 2 tablets by mouth daily., Disp: , Rfl:  .  non-metallic deodorant (ALRA) MISC, Apply 1 application topically daily as needed., Disp: , Rfl:  .  potassium chloride (KLOR-CON) 8 MEQ tablet, Take 1 tablet (8 mEq total) by mouth daily., Disp: 14 tablet, Rfl: 0 .  tobramycin-dexamethasone (TOBRADEX) ophthalmic solution, Place 1 drop into the right eye every 4 (four) hours while awake. TAKES ONLY 3 DAYS A MONTH-- 2 DAYS BEFORE AND DAY OF EYE INJECTION, Disp: , Rfl:   Allergies: No Known  Allergies  Past Medical History, Surgical history, Social history, and Family History were reviewed and updated.  Review of Systems: As above  Physical Exam:  height is 5' 3" (1.6 m) and weight is 140 lb (63.504 kg). Her oral temperature is 97.8 F (36.6 C). Her blood pressure is 139/74 and her pulse is 86. Her respiration is 16.   Wt Readings from Last 3 Encounters:  07/26/15 140 lb (63.504 kg)  07/23/15 139 lb 14.4 oz (63.458 kg)  07/16/15 139 lb 4.8 oz (63.186 kg)     [name] in no obvious distress. Head and neck exam shows no ocular or oral lesions. She has no palpable cervical or supraclavicular lymph nodes. Thyroid is none palpable. Lungs are clear bilaterally. Cardiac exam regular rate and rhythm with no murmurs, rubs or bruits. Abdomen is soft. She has good bowel sounds. There is no fluid wave. There is no palpable hepatosplenomegaly. Breast exam shows left breast with no masses, edema or erythema. There is no left axillary adenopathy. Right chest wall shows healing mastectomy. She has no erythema or nodularity along the right chest wall.. . Extremities shows no clubbing, cyanosis or edema. There may be some slight nonpitting edema of the lower right arm. Skin exam shows no rashes, ecchymoses or petechia.  Lab Results  Component Value Date   WBC 4.1 07/26/2015   HGB 11.3* 07/26/2015   HCT 34.6* 07/26/2015   MCV 94 07/26/2015     PLT 146 07/26/2015     Chemistry      Component Value Date/Time   NA 139 06/24/2015 1201   NA 140 02/20/2015 0625   NA 142 01/30/2015 1233   K 3.6 06/24/2015 1201   K 4.5 02/20/2015 0625   K 4.4 01/30/2015 1233   CL 106 06/24/2015 1201   CL 107 02/20/2015 0625   CO2 28 06/24/2015 1201   CO2 29 02/20/2015 0625   CO2 25 01/30/2015 1233   BUN 12 06/24/2015 1201   BUN 10 02/20/2015 0625   BUN 12.6 01/30/2015 1233   CREATININE 1.0 06/24/2015 1201   CREATININE 0.81 02/20/2015 0625   CREATININE 0.8 01/30/2015 1233        Component Value Date/Time   CALCIUM 8.9 06/24/2015 1201   CALCIUM 8.7 02/20/2015 0625   CALCIUM 9.7 01/30/2015 1233   ALKPHOS 54 06/24/2015 1201   ALKPHOS 70 02/19/2015 1346   ALKPHOS 77 01/30/2015 1233   AST 23 06/24/2015 1201   AST 29 02/19/2015 1346   AST 19 01/30/2015 1233   ALT 12 06/24/2015 1201   ALT 17 02/19/2015 1346   ALT 12 01/30/2015 1233   BILITOT 1.00 06/24/2015 1201   BILITOT 1.2 02/19/2015 1346   BILITOT 0.59 01/30/2015 1233         Impression and Plan: Regina Cardenas is 81-year-old postmenopausal white female. She has stage IIIa invasive ductal carcinoma of the right breast. She had a very large tumor. She had 2 lymph nodes that were positive. She had lymphovascular space invasion.  I am glad that she continues to improve. She'll finish up her radiation in a week or 2.  When she is done with radiation, then we'll have to get her on an aromatase inhibitor. I would go ahead and plan to get her on Femara. I think this would be very reasonable.   I would like to see her back in another 4-5 weeks.   ENNEVER,PETER R, MD 8/19/20162:45 PM 

## 2015-07-29 ENCOUNTER — Ambulatory Visit
Admission: RE | Admit: 2015-07-29 | Discharge: 2015-07-29 | Disposition: A | Discharge status: Home / Self Care | Payer: Medicare Other | Source: Ambulatory Visit | Attending: Radiation Oncology | Admitting: Radiation Oncology

## 2015-07-29 DIAGNOSIS — Z170 Estrogen receptor positive status [ER+]: Secondary | ICD-10-CM | POA: Diagnosis not present

## 2015-07-29 DIAGNOSIS — Z510 Encounter for antineoplastic radiation therapy: Secondary | ICD-10-CM | POA: Diagnosis not present

## 2015-07-29 DIAGNOSIS — C50211 Malignant neoplasm of upper-inner quadrant of right female breast: Secondary | ICD-10-CM | POA: Diagnosis not present

## 2015-07-30 ENCOUNTER — Ambulatory Visit
Admission: RE | Admit: 2015-07-30 | Discharge: 2015-07-30 | Disposition: A | Discharge status: Home / Self Care | Payer: Medicare Other | Source: Ambulatory Visit | Attending: Radiation Oncology | Admitting: Radiation Oncology

## 2015-07-30 ENCOUNTER — Encounter: Payer: Self-pay | Admitting: Radiation Oncology

## 2015-07-30 ENCOUNTER — Ambulatory Visit: Payer: Medicare Other | Admitting: Radiation Oncology

## 2015-07-30 VITALS — BP 135/70 | HR 66 | Temp 98.3°F | Resp 16 | Wt 138.9 lb

## 2015-07-30 DIAGNOSIS — C50211 Malignant neoplasm of upper-inner quadrant of right female breast: Secondary | ICD-10-CM | POA: Diagnosis not present

## 2015-07-30 DIAGNOSIS — Z510 Encounter for antineoplastic radiation therapy: Secondary | ICD-10-CM | POA: Diagnosis not present

## 2015-07-30 DIAGNOSIS — Z170 Estrogen receptor positive status [ER+]: Secondary | ICD-10-CM | POA: Diagnosis not present

## 2015-07-30 NOTE — Progress Notes (Signed)
  Radiation Oncology         [phone] ________________________________  Name: Regina Cardenas MRN: [id number]  Date: 07/30/2015  DOB: 04/13/1929  Weekly Radiation Therapy Management    ICD-9-CM ICD-10-CM   1. Breast cancer of upper-inner quadrant of right female breast 174.2 C50.211    Principle Diagnosis:   Stage IIIA (T3N1M0) invasive ductal carcinoma of the right breast-ER positive/HER-2 negative  Current Dose: 45 Gy     Planned Dose:  59.4 Gy  Narrative . . . . . . . . The patient presents for routine under treatment assessment.                                   The patient has noticed some itching along the right chest wall area. She does have some fatigue.                                 Set-up films were reviewed.                                 The chart was checked. Physical Findings. . .  weight is 138 lb 14.4 oz (63.005 kg). Her oral temperature is 98.3 F (36.8 C). Her blood pressure is 135/70 and her pulse is 66. Her respiration is 16. . The lungs are clear. The heart has a regular rhythm and rate. The right chest wall area shows brisk erythema without moist desquamation. Impression . . . . . . . The patient is tolerating radiation. Plan . . . . . . . . . . . . Continue treatment as planned.  ________________________________   Blair Promise, PhD, MD

## 2015-07-30 NOTE — Progress Notes (Signed)
wekly rad tx rt chest wall 25/38 completed, chest wall red/, and subcalvian, dryness,  dry desquamation on back, using biafine 3xday at tims, c/o itching but is helping, appetite good, energy level poor but does her on housework, staying hydrated, HOH 2:57 PM BP 135/70 mmHg  Pulse 66  Temp(Src) 98.3 F (36.8 C) (Oral)  Resp 16  Wt 138 lb 14.4 oz (63.005 kg)  Wt Readings from Last 3 Encounters:  07/30/15 138 lb 14.4 oz (63.005 kg)  07/26/15 140 lb (63.504 kg)  07/23/15 139 lb 14.4 oz (63.458 kg)

## 2015-07-31 ENCOUNTER — Ambulatory Visit
Admission: RE | Admit: 2015-07-31 | Discharge: 2015-07-31 | Disposition: A | Discharge status: Home / Self Care | Payer: Medicare Other | Source: Ambulatory Visit | Attending: Radiation Oncology | Admitting: Radiation Oncology

## 2015-07-31 ENCOUNTER — Ambulatory Visit: Payer: Medicare Other

## 2015-07-31 DIAGNOSIS — Z170 Estrogen receptor positive status [ER+]: Secondary | ICD-10-CM | POA: Diagnosis not present

## 2015-07-31 DIAGNOSIS — C50211 Malignant neoplasm of upper-inner quadrant of right female breast: Secondary | ICD-10-CM

## 2015-07-31 DIAGNOSIS — Z510 Encounter for antineoplastic radiation therapy: Secondary | ICD-10-CM | POA: Diagnosis not present

## 2015-07-31 NOTE — Progress Notes (Signed)
  Radiation Oncology         (336) [phone] ________________________________  Name: Regina Cardenas MRN: [id number]  Date: 07/31/2015  DOB: [birth year]  Simulation Verification Note    ICD-9-CM ICD-10-CM   1. Breast cancer of upper-inner quadrant of right female breast 174.2 C50.211     Status: outpatient  NARRATIVE: The patient was brought to the treatment unit and placed in the planned treatment position. The clinical setup was verified. Then port films were obtained and uploaded to the radiation oncology medical record software.  The treatment beams were carefully compared against the planned radiation fields. The position location and shape of the radiation fields was reviewed. They targeted volume of tissue appears to be appropriately covered by the radiation beams. Organs at risk appear to be excluded as planned.  Based on my personal review, I approved the simulation verification. The patient's treatment will proceed as planned.  -----------------------------------  Blair Promise, PhD, MD

## 2015-08-01 ENCOUNTER — Ambulatory Visit: Payer: Medicare Other | Admitting: Radiation Oncology

## 2015-08-01 ENCOUNTER — Ambulatory Visit: Payer: Medicare Other

## 2015-08-01 ENCOUNTER — Ambulatory Visit
Admission: RE | Admit: 2015-08-01 | Discharge: 2015-08-01 | Disposition: A | Discharge status: Home / Self Care | Payer: Medicare Other | Source: Ambulatory Visit | Attending: Radiation Oncology | Admitting: Radiation Oncology

## 2015-08-01 DIAGNOSIS — Z170 Estrogen receptor positive status [ER+]: Secondary | ICD-10-CM | POA: Diagnosis not present

## 2015-08-01 DIAGNOSIS — C50211 Malignant neoplasm of upper-inner quadrant of right female breast: Secondary | ICD-10-CM | POA: Diagnosis not present

## 2015-08-01 DIAGNOSIS — Z510 Encounter for antineoplastic radiation therapy: Secondary | ICD-10-CM | POA: Diagnosis not present

## 2015-08-02 ENCOUNTER — Ambulatory Visit
Admission: RE | Admit: 2015-08-02 | Discharge: 2015-08-02 | Disposition: A | Discharge status: Home / Self Care | Payer: Medicare Other | Source: Ambulatory Visit | Attending: Radiation Oncology | Admitting: Radiation Oncology

## 2015-08-02 DIAGNOSIS — C50211 Malignant neoplasm of upper-inner quadrant of right female breast: Secondary | ICD-10-CM | POA: Diagnosis not present

## 2015-08-02 DIAGNOSIS — Z510 Encounter for antineoplastic radiation therapy: Secondary | ICD-10-CM | POA: Diagnosis not present

## 2015-08-02 DIAGNOSIS — Z170 Estrogen receptor positive status [ER+]: Secondary | ICD-10-CM | POA: Diagnosis not present

## 2015-08-05 ENCOUNTER — Ambulatory Visit
Admission: RE | Admit: 2015-08-05 | Discharge: 2015-08-05 | Disposition: A | Discharge status: Home / Self Care | Payer: Medicare Other | Source: Ambulatory Visit | Attending: Radiation Oncology | Admitting: Radiation Oncology

## 2015-08-05 DIAGNOSIS — C50211 Malignant neoplasm of upper-inner quadrant of right female breast: Secondary | ICD-10-CM | POA: Diagnosis not present

## 2015-08-05 DIAGNOSIS — Z510 Encounter for antineoplastic radiation therapy: Secondary | ICD-10-CM | POA: Diagnosis not present

## 2015-08-05 DIAGNOSIS — Z170 Estrogen receptor positive status [ER+]: Secondary | ICD-10-CM | POA: Diagnosis not present

## 2015-08-06 ENCOUNTER — Encounter: Payer: Self-pay | Admitting: Radiation Oncology

## 2015-08-06 ENCOUNTER — Ambulatory Visit
Admission: RE | Admit: 2015-08-06 | Discharge: 2015-08-06 | Disposition: A | Discharge status: Home / Self Care | Payer: Medicare Other | Source: Ambulatory Visit | Attending: Radiation Oncology | Admitting: Radiation Oncology

## 2015-08-06 VITALS — BP 116/72 | HR 67 | Temp 98.0°F | Resp 16 | Ht 63.0 in | Wt 139.7 lb

## 2015-08-06 DIAGNOSIS — C50211 Malignant neoplasm of upper-inner quadrant of right female breast: Secondary | ICD-10-CM

## 2015-08-06 DIAGNOSIS — Z510 Encounter for antineoplastic radiation therapy: Secondary | ICD-10-CM | POA: Diagnosis not present

## 2015-08-06 DIAGNOSIS — Z170 Estrogen receptor positive status [ER+]: Secondary | ICD-10-CM | POA: Diagnosis not present

## 2015-08-06 NOTE — Progress Notes (Signed)
  Radiation Oncology         [phone] ________________________________  Name: Regina Cardenas MRN: [id number]  Date: 08/06/2015  DOB: 16-Aug-1929  Weekly Radiation Therapy Management    ICD-9-CM ICD-10-CM   1. Breast cancer of upper-inner quadrant of right female breast 174.2 C50.211     Current Dose: 54 Gy     Planned Dose:  59.4 Gy  Narrative . . . . . . . . The patient presents for routine under treatment assessment.                                   She reports discomfort due to burning and itching on her right chest well. It is keeping her awake at night. She reports fatigue.                                 Set-up films were reviewed.                                 The chart was checked. Physical Findings. . .  height is 5\' 3"  (1.6 m) and weight is 139 lb 11.2 oz (63.368 kg). Her oral temperature is 98 F (36.7 C). Her blood pressure is 116/72 and her pulse is 67. Her respiration is 16. .the lungs are clear. The heart has a regular rhythm and rate. The right chest wall area shows significant erythema  in the upper inner aspect and infraclavicular area. Patient has dry desquamation and impending moist desquamation  Impression . . . . . . . The patient is tolerating radiation. Plan . . . . . . . . . . . . Continue treatment as planned. She will start using Silvadene on the most significant areas of skin reaction. Biafine elsewhere.  ________________________________   Blair Promise, PhD, MD

## 2015-08-06 NOTE — Progress Notes (Signed)
Regina Cardenas has completed 30 fractions to her right chest wall.  She reports discomfort due to burning and itching on her right chest well.  It is keeping her awake at night.  She reports fatigue.  She has scabbed areas on her right upper chest and mid chest area.  Her right chest and upper back is red.  She has been given a refill on biafine.  BP 116/72 mmHg  Pulse 67  Temp(Src) 98 F (36.7 C) (Oral)  Resp 16  Ht 5\' 3"  (1.6 m)  Wt 139 lb 11.2 oz (63.368 kg)  BMI 24.75 kg/m2

## 2015-08-07 ENCOUNTER — Ambulatory Visit
Admission: RE | Admit: 2015-08-07 | Discharge: 2015-08-07 | Disposition: A | Discharge status: Home / Self Care | Payer: Medicare Other | Source: Ambulatory Visit | Attending: Radiation Oncology | Admitting: Radiation Oncology

## 2015-08-07 DIAGNOSIS — C50211 Malignant neoplasm of upper-inner quadrant of right female breast: Secondary | ICD-10-CM | POA: Diagnosis not present

## 2015-08-07 DIAGNOSIS — Z510 Encounter for antineoplastic radiation therapy: Secondary | ICD-10-CM | POA: Diagnosis not present

## 2015-08-07 DIAGNOSIS — Z170 Estrogen receptor positive status [ER+]: Secondary | ICD-10-CM | POA: Diagnosis not present

## 2015-08-08 ENCOUNTER — Ambulatory Visit
Admission: RE | Admit: 2015-08-08 | Discharge: 2015-08-08 | Disposition: A | Discharge status: Home / Self Care | Payer: Medicare Other | Source: Ambulatory Visit | Attending: Radiation Oncology | Admitting: Radiation Oncology

## 2015-08-08 VITALS — BP 118/73 | HR 86 | Temp 98.0°F | Wt 135.3 lb

## 2015-08-08 DIAGNOSIS — Z170 Estrogen receptor positive status [ER+]: Secondary | ICD-10-CM | POA: Diagnosis not present

## 2015-08-08 DIAGNOSIS — C50211 Malignant neoplasm of upper-inner quadrant of right female breast: Secondary | ICD-10-CM

## 2015-08-08 DIAGNOSIS — Z510 Encounter for antineoplastic radiation therapy: Secondary | ICD-10-CM | POA: Diagnosis not present

## 2015-08-08 MED ORDER — BIAFINE EX EMUL
CUTANEOUS | Status: DC | PRN
  Administered 2015-08-08: 17:00:00 via TOPICAL

## 2015-08-08 NOTE — Progress Notes (Signed)
Patient will completed radiation to right chestwall on tomorrow.Has some discomfort but states the itching has improved.Dry desquamation of chestwall is red with flaking of skin.Using silvadene and biafine.given another tube to apply until healed.One month follow up scheduled. BP 118/73 mmHg  Pulse 86  Temp(Src) 98 F (36.7 C)  Wt 135 lb 4.8 oz (61.372 kg)

## 2015-08-08 NOTE — Progress Notes (Signed)
  Radiation Oncology         (336) [phone] ________________________________  Name: Regina Cardenas MRN: [id number]  Date: 08/08/2015  DOB: October 22, 1929  Weekly Radiation Therapy Management    ICD-9-CM ICD-10-CM   1. Breast cancer of upper-inner quadrant of right female breast 174.2 C50.211 topical emolient (BIAFINE) emulsion   Current Dose: 57.6 Gy     Planned Dose:  59.4 Gy  Narrative: The patient presents for routine under treatment assessment. She stated that she has had less itching since starting the Silvadene. She denies experiencing symptoms of pain and is otherwise without complaint. Set-up films were reviewed and the chart was checked. The patient projected a healthy mental status for today's radiation oncology appointment.  Physical Findings:   weight is 135 lb 4.8 oz (61.372 kg). Her temperature is 98 F (36.7 C). Her blood pressure is 118/73 and her pulse is 86.  The lungs are clear. The heart has a regular rhythm and rate. The right chest wall area shows significant erythema  in the upper inner aspect and infraclavicular area. Patient has dry desquamation and impending moist desquamation.  Impression: Regina Cardenas is a 81 year old female presenting to clinic in regards to her cancer of the right female breast. The patient is tolerating radiation.  Plan: The patient has been advised to continue treatment as planned. She is instructed to continue the use of Silvadene on the most significant areas of skin reaction and Biafine elsewhere as needed to alleviate symptoms. The patient is advised of her routine follow-up appointment with radiation oncology to take place as scheduled. All vocalized questions and concerns have been addressed. If the patient develops and further questions or concerns in regards to her treatment or recovery, she has been encouraged to contact Dr. Sondra Come, MD, PhD.   This document serves as a record of services personally performed by Gery Pray, MD. It was  created on his behalf by Lenn Cal, a trained medical scribe. The creation of this record is based on the scribe's personal observations and the provider's statements to them. This document has been checked and approved by the attending provider.   ________________________________   Blair Promise, PhD, MD

## 2015-08-09 ENCOUNTER — Ambulatory Visit
Admission: RE | Admit: 2015-08-09 | Discharge: 2015-08-09 | Disposition: A | Discharge status: Home / Self Care | Payer: Medicare Other | Source: Ambulatory Visit | Attending: Radiation Oncology | Admitting: Radiation Oncology

## 2015-08-09 ENCOUNTER — Ambulatory Visit: Payer: Medicare Other

## 2015-08-09 ENCOUNTER — Encounter: Payer: Self-pay | Admitting: Radiation Oncology

## 2015-08-09 DIAGNOSIS — C50211 Malignant neoplasm of upper-inner quadrant of right female breast: Secondary | ICD-10-CM | POA: Diagnosis not present

## 2015-08-09 DIAGNOSIS — Z170 Estrogen receptor positive status [ER+]: Secondary | ICD-10-CM | POA: Diagnosis not present

## 2015-08-09 DIAGNOSIS — Z510 Encounter for antineoplastic radiation therapy: Secondary | ICD-10-CM | POA: Diagnosis not present

## 2015-08-13 ENCOUNTER — Ambulatory Visit: Payer: Medicare Other

## 2015-08-14 ENCOUNTER — Ambulatory Visit: Payer: Medicare Other

## 2015-08-14 NOTE — Progress Notes (Signed)
  Radiation Oncology         (336) [phone] ________________________________  Name: ARBOR LEER MRN: [id number]  Date: 08/09/2015  DOB: November 08, 1929  End of Treatment Note   ICD-9-CM ICD-10-CM    1. Breast cancer of upper-inner quadrant of right female breast 174.2 C50.211    Diagnosis: Stage IIIA (T3N1M0) invasive ductal carcinoma of the right breast-ER positive/HER-2 negative     Indication for treatment:  Postmastectomy to reduce chances for local regional recurrence      Radiation treatment dates:   06/26/2015-08/09/2015  Site/dose:   Right chest wall high axilla and supraclavicular region 45 gray in 25 fractions, the mastectomy scar boost 14.4 gray for cum. dose of 59.4 gray  Beams/energy:   3-D conformal 4 initial 4 field set up, bolus was placed along the right chest wall every other day, chest wall boost using a custom electron cutout field, 6 MeV electrons, 0.6 cm bolus  Narrative: The patient tolerated radiation treatment relatively well.  She did experience some fatigue as well as some itching and discomfort along the treatment area. She also developed moist desquamation towards the end of treatment which responded well to Silvadene.  Plan: The patient has completed radiation treatment. The patient will return to radiation oncology clinic for routine followup in one month. I advised them to call or return sooner if they have any questions or concerns related to their recovery or treatment.  -----------------------------------  Blair Promise, PhD, MD

## 2015-08-16 DIAGNOSIS — H43813 Vitreous degeneration, bilateral: Secondary | ICD-10-CM | POA: Diagnosis not present

## 2015-08-16 DIAGNOSIS — H3531 Nonexudative age-related macular degeneration: Secondary | ICD-10-CM | POA: Diagnosis not present

## 2015-08-16 DIAGNOSIS — H3532 Exudative age-related macular degeneration: Secondary | ICD-10-CM | POA: Diagnosis not present

## 2015-08-23 DIAGNOSIS — H3532 Exudative age-related macular degeneration: Secondary | ICD-10-CM | POA: Diagnosis not present

## 2015-08-27 ENCOUNTER — Other Ambulatory Visit: Payer: Self-pay | Admitting: Radiation Oncology

## 2015-08-27 DIAGNOSIS — C50211 Malignant neoplasm of upper-inner quadrant of right female breast: Secondary | ICD-10-CM

## 2015-08-30 ENCOUNTER — Encounter: Payer: Self-pay | Admitting: Hematology & Oncology

## 2015-08-30 ENCOUNTER — Telehealth: Payer: Self-pay | Admitting: *Deleted

## 2015-08-30 ENCOUNTER — Ambulatory Visit (HOSPITAL_BASED_OUTPATIENT_CLINIC_OR_DEPARTMENT_OTHER): Payer: Medicare Other | Admitting: Hematology & Oncology

## 2015-08-30 ENCOUNTER — Other Ambulatory Visit (HOSPITAL_BASED_OUTPATIENT_CLINIC_OR_DEPARTMENT_OTHER): Payer: Medicare Other

## 2015-08-30 VITALS — BP 121/62 | HR 70 | Temp 97.5°F | Resp 16 | Ht 63.0 in | Wt 139.0 lb

## 2015-08-30 DIAGNOSIS — E559 Vitamin D deficiency, unspecified: Secondary | ICD-10-CM

## 2015-08-30 DIAGNOSIS — C50211 Malignant neoplasm of upper-inner quadrant of right female breast: Secondary | ICD-10-CM

## 2015-08-30 DIAGNOSIS — C50911 Malignant neoplasm of unspecified site of right female breast: Secondary | ICD-10-CM

## 2015-08-30 DIAGNOSIS — C50219 Malignant neoplasm of upper-inner quadrant of unspecified female breast: Secondary | ICD-10-CM | POA: Diagnosis not present

## 2015-08-30 LAB — CBC WITH DIFFERENTIAL (CANCER CENTER ONLY)
BASO#: 0 10*3/uL (ref 0.0–0.2)
BASO%: 1.3 % (ref 0.0–2.0)
EOS%: 4.1 % (ref 0.0–7.0)
Eosinophils Absolute: 0.1 10*3/uL (ref 0.0–0.5)
HEMATOCRIT: 38.4 % (ref 34.8–46.6)
HGB: 12.6 g/dL (ref 11.6–15.9)
LYMPH#: 0.4 10*3/uL — AB (ref 0.9–3.3)
LYMPH%: 12.3 % — ABNORMAL LOW (ref 14.0–48.0)
MCH: 29.6 pg (ref 26.0–34.0)
MCHC: 32.8 g/dL (ref 32.0–36.0)
MCV: 90 fL (ref 81–101)
MONO#: 0.3 10*3/uL (ref 0.1–0.9)
MONO%: 10.7 % (ref 0.0–13.0)
NEUT%: 71.6 % (ref 39.6–80.0)
NEUTROS ABS: 2.3 10*3/uL (ref 1.5–6.5)
Platelets: 148 10*3/uL (ref 145–400)
RBC: 4.25 10*6/uL (ref 3.70–5.32)
RDW: 13.1 % (ref 11.1–15.7)
WBC: 3.2 10*3/uL — ABNORMAL LOW (ref 3.9–10.0)

## 2015-08-30 LAB — COMPREHENSIVE METABOLIC PANEL (CC13)
ALBUMIN: 3.3 g/dL — AB (ref 3.5–5.0)
ALT: 14 U/L (ref 0–55)
AST: 22 U/L (ref 5–34)
Alkaline Phosphatase: 85 U/L (ref 40–150)
Anion Gap: 7 mEq/L (ref 3–11)
BUN: 16.1 mg/dL (ref 7.0–26.0)
CO2: 30 mEq/L — ABNORMAL HIGH (ref 22–29)
CREATININE: 0.8 mg/dL (ref 0.6–1.1)
Calcium: 9.3 mg/dL (ref 8.4–10.4)
Chloride: 105 mEq/L (ref 98–109)
EGFR: 71 mL/min/{1.73_m2} — ABNORMAL LOW (ref >90)
GLUCOSE: 80 mg/dL (ref 70–140)
POTASSIUM: 4.3 meq/L (ref 3.5–5.1)
SODIUM: 143 meq/L (ref 136–145)
TOTAL PROTEIN: 5.7 g/dL — AB (ref 6.4–8.3)
Total Bilirubin: 0.61 mg/dL (ref 0.20–1.20)

## 2015-08-30 MED ORDER — LETROZOLE 2.5 MG PO TABS: 2.5000 mg | ORAL_TABLET | Freq: Every day | ORAL | Status: DC

## 2015-08-30 NOTE — Telephone Encounter (Signed)
CALLED PATIENT TO INFORM OF APPT. FOR SURVIVORSHIP APPT. ON 09-06-15, LVM FOR A RETURN CALL

## 2015-08-30 NOTE — Progress Notes (Signed)
Hematology and Oncology Follow Up Visit  Regina Cardenas [id number] 1929/06/02 81 y.o. 08/30/2015   Principle Diagnosis:   Stage IIIA (T3N1M0) invasive ductal carcinoma of the right breast-ER positive/HER-2 negative  Current Therapy:    Status post Docetaxel/Cytoxan cycle #3  Neulasta 6 mg subcutaneous postchemotherapy  Radiation therapy to the right chest wall/axilla  Femara 2.5 mg by mouth daily  Prolia 60 mg subcutaneous every 6 months-start in November     Interim History:  Regina Cardenas is back for follow-up. She continues to improve. She finished the radiation in late August. She is recovering nicely. Her hair is growing back pretty well.  [name] had no problems with nausea vomiting. Her appetite is improving.  Overall, her stamina is also getting better.  [name] had no problem with hot flashes or sweats.  [name] had no change in bowel or bladder habits. [name] had no arm or leg swelling. There's been no lymphedema of the right arm.  [name] had no bleeding.  She is here for to the weekend and just been able to relax.  Overall, her performance status is ECOG 1. 1 .  Medications:  Current outpatient prescriptions:  .  Aflibercept (EYLEA IO), Inject into the eye. Every 4 weeks at eye MD, Disp: , Rfl:  .  aspirin 325 MG EC tablet, Take 325 mg by mouth 2 (two) times daily. , Disp: , Rfl:  .  cholecalciferol (VITAMIN D) 1000 UNITS tablet, Take 2,000 Units by mouth daily., Disp: , Rfl:  .  Multiple Vitamin (MULTIVITAMIN) tablet, Take 1 tablet by mouth daily., Disp: , Rfl:  .  Multiple Vitamins-Minerals (PRESERVISION AREDS 2 PO), Take 2 tablets by mouth daily., Disp: , Rfl:  .  non-metallic deodorant (ALRA) MISC, Apply 1 application topically daily as needed., Disp: , Rfl:  .  tobramycin-dexamethasone (TOBRADEX) ophthalmic solution, Place 1 drop into the right eye every 4 (four) hours while awake. TAKES ONLY 3 DAYS A MONTH-- 2 DAYS BEFORE AND DAY OF EYE INJECTION, Disp: ,  Rfl:  .  emollient (BIAFINE) cream, Apply topically 2 (two) times daily., Disp: , Rfl:  .  letrozole (FEMARA) 2.5 MG tablet, Take 1 tablet (2.5 mg total) by mouth daily., Disp: 90 tablet, Rfl: 3 .  potassium chloride (KLOR-CON) 8 MEQ tablet, Take 1 tablet (8 mEq total) by mouth daily. (Patient not taking: Reported on 07/30/2015), Disp: 14 tablet, Rfl: 0  Allergies: No Known Allergies  Past Medical History, Surgical history, Social history, and Family History were reviewed and updated.  Review of Systems: As above  Physical Exam:  height is 5' 3" (1.6 m) and weight is 139 lb (63.05 kg). Her oral temperature is 97.5 F (36.4 C). Her blood pressure is 121/62 and her pulse is 70. Her respiration is 16.   Wt Readings from Last 3 Encounters:  08/30/15 139 lb (63.05 kg)  08/08/15 135 lb 4.8 oz (61.372 kg)  08/06/15 139 lb 11.2 oz (63.368 kg)     [name] in no obvious distress. Head and neck exam shows no ocular or oral lesions. She has no palpable cervical or supraclavicular lymph nodes. Thyroid is none palpable. Lungs are clear bilaterally. Cardiac exam regular rate and rhythm with no murmurs, rubs or bruits. Abdomen is soft. She has good bowel sounds. There is no fluid wave. There is no palpable hepatosplenomegaly. Breast exam shows left breast with no masses, edema or erythema. There is no left axillary adenopathy. Right chest wall shows healing mastectomy.  She has no erythema or nodularity along the right chest wall.. . Extremities shows no clubbing, cyanosis or edema. There may be some slight nonpitting edema of the lower right arm. Skin exam shows no rashes, ecchymoses or petechia.  Lab Results  Component Value Date   WBC 3.2* 08/30/2015   HGB 12.6 08/30/2015   HCT 38.4 08/30/2015   MCV 90 08/30/2015   PLT 148 08/30/2015     Chemistry      Component Value Date/Time   NA 142 07/26/2015 1142   NA 139 06/24/2015 1201   NA 140 02/20/2015 0625   K 4.2  07/26/2015 1142   K 3.6 06/24/2015 1201   K 4.5 02/20/2015 0625   CL 106 06/24/2015 1201   CL 107 02/20/2015 0625   CO2 28 07/26/2015 1142   CO2 28 06/24/2015 1201   CO2 29 02/20/2015 0625   BUN 10.1 07/26/2015 1142   BUN 12 06/24/2015 1201   BUN 10 02/20/2015 0625   CREATININE 0.7 07/26/2015 1142   CREATININE 1.0 06/24/2015 1201   CREATININE 0.81 02/20/2015 0625      Component Value Date/Time   CALCIUM 9.3 07/26/2015 1142   CALCIUM 8.9 06/24/2015 1201   CALCIUM 8.7 02/20/2015 0625   ALKPHOS 68 07/26/2015 1142   ALKPHOS 54 06/24/2015 1201   ALKPHOS 70 02/19/2015 1346   AST 23 07/26/2015 1142   AST 23 06/24/2015 1201   AST 29 02/19/2015 1346   ALT 13 07/26/2015 1142   ALT 12 06/24/2015 1201   ALT 17 02/19/2015 1346   BILITOT 0.44 07/26/2015 1142   BILITOT 1.00 06/24/2015 1201   BILITOT 1.2 02/19/2015 1346         Impression and Plan: Regina Cardenas is 81 year old postmenopausal white female. She has stage IIIa invasive ductal carcinoma of the right breast. She had a very large tumor. She had 2 lymph nodes that were positive. She had lymphovascular space invasion.  I she really looks good. I'm just thankful that she is improving.  For now, we will get her on Femara. I think this would be very reasonable for her. I talked to her about Femara. I went over some of the side effects.  I also think that she would benefit from Prolia. Recent studies have shown that Prolia, given twice a year, does decrease the incidence of Bowman chassis for women who have early stage breast cancer that aren't postmenopausal. This also will help her bones given that Femara could lead to some osteopenia.   I would like to see her back in another 6 weeks. We will plan to do Prolia at that time.  Volanda Napoleon, MD 9/23/201612:48 PM

## 2015-08-31 LAB — VITAMIN D 25 HYDROXY (VIT D DEFICIENCY, FRACTURES): Vit D, 25-Hydroxy: 30 ng/mL (ref 30–100)

## 2015-09-05 ENCOUNTER — Telehealth: Payer: Self-pay | Admitting: *Deleted

## 2015-09-05 NOTE — Telephone Encounter (Signed)
Pt called and discussed that she did not want to come to survivorship appt. I discussed the program and appt in further detail to the pt and recommendations for pt to attend appt. Pt relate she "has all I can take" and denied r/s appt. Informed pt she will receive her survivorship care plan summary.

## 2015-09-06 ENCOUNTER — Encounter: Payer: Medicare Other | Admitting: Nurse Practitioner

## 2015-09-16 DIAGNOSIS — H353231 Exudative age-related macular degeneration, bilateral, with active choroidal neovascularization: Secondary | ICD-10-CM | POA: Diagnosis not present

## 2015-09-16 DIAGNOSIS — H43813 Vitreous degeneration, bilateral: Secondary | ICD-10-CM | POA: Diagnosis not present

## 2015-09-25 ENCOUNTER — Encounter: Payer: Self-pay | Admitting: Oncology

## 2015-09-26 ENCOUNTER — Telehealth: Payer: Self-pay | Admitting: Oncology

## 2015-09-26 ENCOUNTER — Ambulatory Visit: Admission: RE | Admit: 2015-09-26 | Payer: Medicare Other | Source: Ambulatory Visit | Admitting: Radiation Oncology

## 2015-09-26 ENCOUNTER — Ambulatory Visit
Admission: RE | Admit: 2015-09-26 | Discharge: 2015-09-26 | Disposition: A | Discharge status: Home / Self Care | Payer: Medicare Other | Source: Ambulatory Visit | Attending: Radiation Oncology | Admitting: Radiation Oncology

## 2015-09-26 NOTE — Telephone Encounter (Signed)
Tried to call Shaelee about her appointment this morning with Dr. Sondra Come.  There was no answer.

## 2015-09-27 DIAGNOSIS — H353221 Exudative age-related macular degeneration, left eye, with active choroidal neovascularization: Secondary | ICD-10-CM | POA: Diagnosis not present

## 2015-10-03 ENCOUNTER — Ambulatory Visit
Admission: RE | Admit: 2015-10-03 | Discharge: 2015-10-03 | Disposition: A | Discharge status: Home / Self Care | Payer: Medicare Other | Source: Ambulatory Visit | Attending: Radiation Oncology | Admitting: Radiation Oncology

## 2015-10-03 ENCOUNTER — Encounter: Payer: Self-pay | Admitting: Radiation Oncology

## 2015-10-03 VITALS — BP 121/74 | HR 79 | Temp 97.9°F | Ht 63.0 in | Wt 142.5 lb

## 2015-10-03 DIAGNOSIS — C50211 Malignant neoplasm of upper-inner quadrant of right female breast: Secondary | ICD-10-CM

## 2015-10-03 NOTE — Progress Notes (Signed)
Radiation Oncology         [phone] ________________________________  Name: Regina Cardenas MRN: [id number]  Date: 10/03/2015  DOB: [birth year]  Follow-Up Visit Note  CC: No PCP Per Patient  Volanda Napoleon, MD  No diagnosis found.  Diagnosis: Stage IIIA (T3N1M0) invasive ductal carcinoma of the right female breast ER positive/HER-2 negative [Postmastectomy to reduce chances for local regional recurrence]    Interval Since Last Radiation:  1 months and 3 weeks [06/26/2015-08/09/2015]  Narrative:  The patient returns today for her routine follow-up radiation oncology appointment. She reports some mild fatigue after exertion or strenous activity. To address this issue, she will nap on occasion. She recently began walking daily and this has allowed her to feel better. The patient denies itching or tenderness of the right breast at this time. There is discomfort "only in my arms sometimes, usually when I've lifted too much" but no signs of swelling. The patient reports that she is not using biafine cream. The patient projected a healthy mental status and was not accompanied by family for today's visit. She reports seeing Dr. Marin Olp, MD of medical oncology on a regular basis and is currently taking an estrogen blocker. Next Monday, she will receive an injection to counteract development of osteoporosis.           ALLERGIES:  has No Known Allergies.  Meds: Current Outpatient Prescriptions  Medication Sig Dispense Refill  . Aflibercept (EYLEA IO) Inject into the eye. Every 4 weeks at eye MD    . aspirin 325 MG EC tablet Take 325 mg by mouth 2 (two) times daily.     . cholecalciferol (VITAMIN D) 1000 UNITS tablet Take 2,000 Units by mouth daily.    Marland Kitchen letrozole (FEMARA) 2.5 MG tablet Take 1 tablet (2.5 mg total) by mouth daily. 90 tablet 3  . Multiple Vitamin (MULTIVITAMIN) tablet Take 1 tablet by mouth daily.    . Multiple Vitamins-Minerals (PRESERVISION AREDS 2 PO) Take 2 tablets by  mouth daily.    . non-metallic deodorant Jethro Poling) MISC Apply 1 application topically daily as needed.    . tobramycin-dexamethasone (TOBRADEX) ophthalmic solution Place 1 drop into the right eye every 4 (four) hours while awake. TAKES ONLY 3 DAYS A MONTH-- 2 DAYS BEFORE AND DAY OF EYE INJECTION    . emollient (BIAFINE) cream Apply topically 2 (two) times daily.    . potassium chloride (KLOR-CON) 8 MEQ tablet Take 1 tablet (8 mEq total) by mouth daily. (Patient not taking: Reported on 07/30/2015) 14 tablet 0   No current facility-administered medications for this encounter.    Physical Findings: The patient is in no acute distress. Patient is alert and oriented. There is no significant changes to the status of overall health to be noted at this time.  height is 5' 3"  (1.6 m) and weight is 142 lb 8 oz (64.638 kg). Her temperature is 97.9 F (36.6 C). Her blood pressure is 121/74 and her pulse is 79.    Lungs are clear to auscultation bilaterally. Heart has regular rate and rhythm. No palpable cervical, supraclavicular, or axillary adenopathy. Breast: The left is large and pendulous without mass or nipple discharge. The right chest wall area shows hyperpigmentation changes and dry desquamation. No palpable or visible signs of reoccurrence.   Lab Findings: Lab Results  Component Value Date   WBC 3.2* 08/30/2015   HGB 12.6 08/30/2015   HCT 38.4 08/30/2015   MCV 90 08/30/2015   PLT 148 08/30/2015  Radiographic Findings: No results found.  Impression: Regina Cardenas is a 81 year old female presenting to clinic in regards to her Stage IIIA (T3N1M0) invasive ductal carcinoma of the right female breast. The patient is recovering from the effects of radiation and surgery. She understands the importance of completing an annual mammogram. No evidence of reoccurrence observed via today's physical exam. The patient understands that she can access her appointments and medical records via  [facility].   Plan: Healthy methods of management in regards to reported symptoms were reviewed in detail. She will attend her appointment with Dr. Marin Olp, MD of medical oncology next Monday on 10/07/2015. She will complete an annual mammogram. She is to continue her prescribed medications appropriately as previously instructed. All vocalized questions and concerns have been addressed. She in not required to apply vitamin E lotion to area of treatment, but understands the benefits to alleviate symptoms if she ever required the need. If the patient develops any further questions or concerns in regards to her treatment and recovery, she has been encouraged to contact Dr. Sondra Come, MD. She is aware of her follow-up appointment with radiation oncology to take place on an as needed basis, moving forward.   This document serves as a record of services personally performed by Gery Pray, MD. It was created on his behalf by Lenn Cal, a trained medical scribe. The creation of this record is based on the scribe's personal observations and the provider's statements to them. This document has been checked and approved by the attending provider.    -----------------------------------  Blair Promise, PhD, MD

## 2015-10-03 NOTE — Progress Notes (Signed)
Ms. Regina Cardenas is here for follow up of radiation completed  08/09/2015 to her Right Breast. She reports some mild fatigue after lifting items and doing heavier housework at home. She reports taking an occasional nap. She does say she just started walking daily, and that helps her feel better. There is still some mild redness to her anterior Right Breast, but she denies any itching or tenderness at this site. She is not using the biafine cream at this time.  BP 121/74 mmHg  Pulse 79  Temp(Src) 97.9 F (36.6 C)  Ht 5\' 3"  (1.6 m)  Wt 142 lb 8 oz (64.638 kg)  BMI 25.25 kg/m2

## 2015-10-14 ENCOUNTER — Ambulatory Visit (HOSPITAL_BASED_OUTPATIENT_CLINIC_OR_DEPARTMENT_OTHER): Payer: Medicare Other | Admitting: Hematology & Oncology

## 2015-10-14 ENCOUNTER — Encounter: Payer: Self-pay | Admitting: Hematology & Oncology

## 2015-10-14 ENCOUNTER — Ambulatory Visit (HOSPITAL_BASED_OUTPATIENT_CLINIC_OR_DEPARTMENT_OTHER): Payer: Medicare Other

## 2015-10-14 ENCOUNTER — Other Ambulatory Visit (HOSPITAL_BASED_OUTPATIENT_CLINIC_OR_DEPARTMENT_OTHER): Payer: Medicare Other

## 2015-10-14 VITALS — BP 145/74 | HR 88 | Temp 97.8°F | Resp 18 | Ht 63.0 in | Wt 138.0 lb

## 2015-10-14 DIAGNOSIS — E559 Vitamin D deficiency, unspecified: Secondary | ICD-10-CM

## 2015-10-14 DIAGNOSIS — C50911 Malignant neoplasm of unspecified site of right female breast: Secondary | ICD-10-CM

## 2015-10-14 DIAGNOSIS — C50919 Malignant neoplasm of unspecified site of unspecified female breast: Secondary | ICD-10-CM | POA: Diagnosis not present

## 2015-10-14 DIAGNOSIS — C50211 Malignant neoplasm of upper-inner quadrant of right female breast: Secondary | ICD-10-CM

## 2015-10-14 DIAGNOSIS — T386X5A Adverse effect of antigonadotrophins, antiestrogens, antiandrogens, not elsewhere classified, initial encounter: Secondary | ICD-10-CM

## 2015-10-14 DIAGNOSIS — Z79811 Long term (current) use of aromatase inhibitors: Secondary | ICD-10-CM

## 2015-10-14 DIAGNOSIS — M818 Other osteoporosis without current pathological fracture: Secondary | ICD-10-CM

## 2015-10-14 LAB — COMPREHENSIVE METABOLIC PANEL (CC13)
ALK PHOS: 91 U/L (ref 40–150)
ALT: 12 U/L (ref 0–55)
AST: 19 U/L (ref 5–34)
Albumin: 3.4 g/dL — ABNORMAL LOW (ref 3.5–5.0)
Anion Gap: 9 mEq/L (ref 3–11)
BUN: 15.8 mg/dL (ref 7.0–26.0)
CHLORIDE: 105 meq/L (ref 98–109)
CO2: 27 meq/L (ref 22–29)
Calcium: 9.5 mg/dL (ref 8.4–10.4)
Creatinine: 0.8 mg/dL (ref 0.6–1.1)
EGFR: 70 mL/min/{1.73_m2} — AB (ref >90)
GLUCOSE: 74 mg/dL (ref 70–140)
POTASSIUM: 3.8 meq/L (ref 3.5–5.1)
SODIUM: 141 meq/L (ref 136–145)
Total Bilirubin: 0.83 mg/dL (ref 0.20–1.20)
Total Protein: 5.8 g/dL — ABNORMAL LOW (ref 6.4–8.3)

## 2015-10-14 LAB — CBC WITH DIFFERENTIAL (CANCER CENTER ONLY)
BASO#: 0.1 10*3/uL (ref 0.0–0.2)
BASO%: 1.2 % (ref 0.0–2.0)
EOS%: 2.7 % (ref 0.0–7.0)
Eosinophils Absolute: 0.1 10*3/uL (ref 0.0–0.5)
HCT: 38.7 % (ref 34.8–46.6)
HGB: 12.8 g/dL (ref 11.6–15.9)
LYMPH#: 0.5 10*3/uL — ABNORMAL LOW (ref 0.9–3.3)
LYMPH%: 12.6 % — ABNORMAL LOW (ref 14.0–48.0)
MCH: 29.4 pg (ref 26.0–34.0)
MCHC: 33.1 g/dL (ref 32.0–36.0)
MCV: 89 fL (ref 81–101)
MONO#: 0.4 10*3/uL (ref 0.1–0.9)
MONO%: 9 % (ref 0.0–13.0)
NEUT%: 74.5 % (ref 39.6–80.0)
NEUTROS ABS: 3.1 10*3/uL (ref 1.5–6.5)
PLATELETS: 153 10*3/uL (ref 145–400)
RBC: 4.35 10*6/uL (ref 3.70–5.32)
RDW: 13.7 % (ref 11.1–15.7)
WBC: 4.1 10*3/uL (ref 3.9–10.0)

## 2015-10-14 LAB — LACTATE DEHYDROGENASE (CC13): LDH: 176 U/L (ref 125–245)

## 2015-10-14 MED ORDER — DENOSUMAB 60 MG/ML ~~LOC~~ SOLN
60.0000 mg | Freq: Once | SUBCUTANEOUS | Status: AC
  Administered 2015-10-14: 60 mg via SUBCUTANEOUS
  Filled 2015-10-14: qty 1

## 2015-10-14 NOTE — Progress Notes (Signed)
Hematology and Oncology Follow Up Visit  Regina Cardenas [id number] [birth year] 81 y.o. 10/14/2015   Principle Diagnosis:   Stage IIIA (T3N1M0) invasive ductal carcinoma of the right breast-ER positive/HER-2 negative  Current Therapy:    Status post Docetaxel/Cytoxan cycle #3  Neulasta 6 mg subcutaneous postchemotherapy  Radiation therapy to the right chest wall/axilla  Femara 2.5 mg by mouth daily  Prolia 60 mg subcutaneous every 6 months-start in November     Interim History:  Regina Cardenas is back for follow-up. She continues to improve. She finished the radiation in late August. She is recovering nicely. Her hair is growing back pretty well.  She really looks good. Her hair is really coming in nicely.  [name] trying to exercise a little bit more.  [name] had no problems with nausea or vomiting. Her appetite has come back nicely.  [name] had no problem with bleeding.  There is no arm swelling.  She has had some hot flashes with the Femara. We can always give her a little something for this if she would like.  We will go ahead and start her Prolia today. I really think this will be helpful for her.  Overall, her performance status is ECOG 1. 1 .  Medications:  Current outpatient prescriptions:  .  Aflibercept (EYLEA IO), Inject into the eye. Every 4 weeks at eye MD, Disp: , Rfl:  .  aspirin 325 MG EC tablet, Take 325 mg by mouth 2 (two) times daily. , Disp: , Rfl:  .  cholecalciferol (VITAMIN D) 1000 UNITS tablet, Take 2,000 Units by mouth daily., Disp: , Rfl:  .  emollient (BIAFINE) cream, Apply topically 2 (two) times daily., Disp: , Rfl:  .  letrozole (FEMARA) 2.5 MG tablet, Take 1 tablet (2.5 mg total) by mouth daily., Disp: 90 tablet, Rfl: 3 .  Multiple Vitamin (MULTIVITAMIN) tablet, Take 1 tablet by mouth daily., Disp: , Rfl:  .  Multiple Vitamins-Minerals (PRESERVISION AREDS 2 PO), Take 2 tablets by mouth daily., Disp: , Rfl:  .  non-metallic deodorant  (ALRA) MISC, Apply 1 application topically daily as needed., Disp: , Rfl:  .  potassium chloride (KLOR-CON) 8 MEQ tablet, Take 1 tablet (8 mEq total) by mouth daily., Disp: 14 tablet, Rfl: 0 .  tobramycin-dexamethasone (TOBRADEX) ophthalmic solution, Place 1 drop into the right eye every 4 (four) hours while awake. TAKES ONLY 3 DAYS A MONTH-- 2 DAYS BEFORE AND DAY OF EYE INJECTION, Disp: , Rfl:  No current facility-administered medications for this visit.  Facility-Administered Medications Ordered in Other Visits:  .  denosumab (PROLIA) injection 60 mg, 60 mg, Subcutaneous, Once, Volanda Napoleon, MD  Allergies: No Known Allergies  Past Medical History, Surgical history, Social history, and Family History were reviewed and updated.  Review of Systems: As above  Physical Exam:  height is _0  (1.6 m) and weight is 138 lb (62.596 kg). Her oral temperature is 97.8 F (36.6 C). Her blood pressure is 145/74 and her pulse is 88. Her respiration is 18.   Wt Readings from Last 3 Encounters:  10/14/15 138 lb (62.596 kg)  10/03/15 142 lb 8 oz (64.638 kg)  08/30/15 139 lb (63.05 kg)     [name] in no obvious distress. Head and neck exam shows no ocular or oral lesions. She has no palpable cervical or supraclavicular lymph nodes. Thyroid is none palpable. Lungs are clear bilaterally. Cardiac exam regular rate and rhythm with no murmurs, rubs or bruits. Abdomen  is soft. She has good bowel sounds. There is no fluid wave. There is no palpable hepatosplenomegaly. Breast exam shows left breast with no masses, edema or erythema. There is no left axillary adenopathy. Right chest wall shows healing mastectomy. She has no erythema or nodularity along the right chest wall.. . Extremities shows no clubbing, cyanosis or edema. There may be some slight nonpitting edema of the lower right arm. Skin exam shows no rashes, ecchymoses or petechia.  Lab Results  Component Value Date    WBC 4.1 10/14/2015   HGB 12.8 10/14/2015   HCT 38.7 10/14/2015   MCV 89 10/14/2015   PLT 153 10/14/2015     Chemistry      Component Value Date/Time   NA 143 08/30/2015 1118   NA 139 06/24/2015 1201   NA 140 02/20/2015 0625   K 4.3 08/30/2015 1118   K 3.6 06/24/2015 1201   K 4.5 02/20/2015 0625   CL 106 06/24/2015 1201   CL 107 02/20/2015 0625   CO2 30* 08/30/2015 1118   CO2 28 06/24/2015 1201   CO2 29 02/20/2015 0625   BUN 16.1 08/30/2015 1118   BUN 12 06/24/2015 1201   BUN 10 02/20/2015 0625   CREATININE 0.8 08/30/2015 1118   CREATININE 1.0 06/24/2015 1201   CREATININE 0.81 02/20/2015 0625      Component Value Date/Time   CALCIUM 9.3 08/30/2015 1118   CALCIUM 8.9 06/24/2015 1201   CALCIUM 8.7 02/20/2015 0625   ALKPHOS 85 08/30/2015 1118   ALKPHOS 54 06/24/2015 1201   ALKPHOS 70 02/19/2015 1346   AST 22 08/30/2015 1118   AST 23 06/24/2015 1201   AST 29 02/19/2015 1346   ALT 14 08/30/2015 1118   ALT 12 06/24/2015 1201   ALT 17 02/19/2015 1346   BILITOT 0.61 08/30/2015 1118   BILITOT 1.00 06/24/2015 1201   BILITOT 1.2 02/19/2015 1346         Impression and Plan: Regina Cardenas is 81 year old postmenopausal white female. She has stage IIIa invasive ductal carcinoma of the right breast. She had a very large tumor. She had 2 lymph nodes that were positive. She had lymphovascular space invasion.  She looks better and better. Her hair is coming back nicely.  I'm just thankful that she will build to enjoy the holidays.  We will go ahead with the Prolia today. I really think this will be helpful for her.  I will plan to get back in 3 months now. Again, she is done very well. I does don't see that we have here back any sooner.  Regimens that she is having some hot flashes with the Femara. I told her we can always help this if necessary.   Volanda Napoleon, MD 11/7/20161:20 PM

## 2015-10-14 NOTE — Patient Instructions (Signed)
Denosumab injection  What is this medicine?  DENOSUMAB (den oh sue mab) slows bone breakdown. Prolia is used to treat osteoporosis in women after menopause and in men. Xgeva is used to prevent bone fractures and other bone problems caused by cancer bone metastases. Xgeva is also used to treat giant cell tumor of the bone.  This medicine may be used for other purposes; ask your health care provider or pharmacist if you have questions.  What should I tell my health care provider before I take this medicine?  They need to know if you have any of these conditions:  -dental disease  -eczema  -infection or history of infections  -kidney disease or on dialysis  -low blood calcium or vitamin D  -malabsorption syndrome  -scheduled to have surgery or tooth extraction  -taking medicine that contains denosumab  -thyroid or parathyroid disease  -an unusual reaction to denosumab, other medicines, foods, dyes, or preservatives  -pregnant or trying to get pregnant  -breast-feeding  How should I use this medicine?  This medicine is for injection under the skin. It is given by a health care professional in a hospital or clinic setting.  If you are getting Prolia, a special MedGuide will be given to you by the pharmacist with each prescription and refill. Be sure to read this information carefully each time.  For Prolia, talk to your pediatrician regarding the use of this medicine in children. Special care may be needed. For Xgeva, talk to your pediatrician regarding the use of this medicine in children. While this drug may be prescribed for children as young as 13 years for selected conditions, precautions do apply.  Overdosage: If you think you have taken too much of this medicine contact a poison control center or emergency room at once.  NOTE: This medicine is only for you. Do not share this medicine with others.  What if I miss a dose?  It is important not to miss your dose. Call your doctor or health care professional if you are  unable to keep an appointment.  What may interact with this medicine?  Do not take this medicine with any of the following medications:  -other medicines containing denosumab  This medicine may also interact with the following medications:  -medicines that suppress the immune system  -medicines that treat cancer  -steroid medicines like prednisone or cortisone  This list may not describe all possible interactions. Give your health care provider a list of all the medicines, herbs, non-prescription drugs, or dietary supplements you use. Also tell them if you smoke, drink alcohol, or use illegal drugs. Some items may interact with your medicine.  What should I watch for while using this medicine?  Visit your doctor or health care professional for regular checks on your progress. Your doctor or health care professional may order blood tests and other tests to see how you are doing.  Call your doctor or health care professional if you get a cold or other infection while receiving this medicine. Do not treat yourself. This medicine may decrease your body's ability to fight infection.  You should make sure you get enough calcium and vitamin D while you are taking this medicine, unless your doctor tells you not to. Discuss the foods you eat and the vitamins you take with your health care professional.  See your dentist regularly. Brush and floss your teeth as directed. Before you have any dental work done, tell your dentist you are receiving this medicine.  Do   not become pregnant while taking this medicine or for 5 months after stopping it. Women should inform their doctor if they wish to become pregnant or think they might be pregnant. There is a potential for serious side effects to an unborn child. Talk to your health care professional or pharmacist for more information.  What side effects may I notice from receiving this medicine?  Side effects that you should report to your doctor or health care professional as soon as  possible:  -allergic reactions like skin rash, itching or hives, swelling of the face, lips, or tongue  -breathing problems  -chest pain  -fast, irregular heartbeat  -feeling faint or lightheaded, falls  -fever, chills, or any other sign of infection  -muscle spasms, tightening, or twitches  -numbness or tingling  -skin blisters or bumps, or is dry, peels, or red  -slow healing or unexplained pain in the mouth or jaw  -unusual bleeding or bruising  Side effects that usually do not require medical attention (Report these to your doctor or health care professional if they continue or are bothersome.):  -muscle pain  -stomach upset, gas  This list may not describe all possible side effects. Call your doctor for medical advice about side effects. You may report side effects to FDA at 1-800-FDA-1088.  Where should I keep my medicine?  This medicine is only given in a clinic, doctor's office, or other health care setting and will not be stored at home.  NOTE: This sheet is a summary. It may not cover all possible information. If you have questions about this medicine, talk to your doctor, pharmacist, or health care provider.      2016, Elsevier/Gold Standard. (2012-05-23 12:37:47)

## 2015-10-15 LAB — VITAMIN D 25 HYDROXY (VIT D DEFICIENCY, FRACTURES): Vit D, 25-Hydroxy: 36 ng/mL (ref 30–100)

## 2015-10-17 DIAGNOSIS — H43813 Vitreous degeneration, bilateral: Secondary | ICD-10-CM | POA: Diagnosis not present

## 2015-10-17 DIAGNOSIS — H353211 Exudative age-related macular degeneration, right eye, with active choroidal neovascularization: Secondary | ICD-10-CM | POA: Diagnosis not present

## 2015-10-17 DIAGNOSIS — H353231 Exudative age-related macular degeneration, bilateral, with active choroidal neovascularization: Secondary | ICD-10-CM | POA: Diagnosis not present

## 2015-10-17 DIAGNOSIS — H353221 Exudative age-related macular degeneration, left eye, with active choroidal neovascularization: Secondary | ICD-10-CM | POA: Diagnosis not present

## 2015-10-30 DIAGNOSIS — H353221 Exudative age-related macular degeneration, left eye, with active choroidal neovascularization: Secondary | ICD-10-CM | POA: Diagnosis not present

## 2015-11-04 ENCOUNTER — Encounter: Payer: Self-pay | Admitting: Nurse Practitioner

## 2015-11-04 DIAGNOSIS — C50911 Malignant neoplasm of unspecified site of right female breast: Secondary | ICD-10-CM

## 2015-11-04 NOTE — Progress Notes (Signed)
The Survivorship Care Plan was mailed to Regina Cardenas as she was unable to come in to the Survivorship Clinic for an in-person visit at this time. A letter was mailed to her outlining the purpose of the content of the care plan, as well as encouraging her to reach out to me with any questions or concerns.  My business card was included in the correspondence to the patient as well.   I will not be placing any follow-up appointments to the Survivorship Clinic for Regina Cardenas, but I am happy to see her at any time in the future for any survivorship concerns that may arise. Thank you for allowing me to participate in her care!  Kenn File, [place] [phone]

## 2015-11-06 DIAGNOSIS — H52203 Unspecified astigmatism, bilateral: Secondary | ICD-10-CM | POA: Diagnosis not present

## 2015-11-06 DIAGNOSIS — H353232 Exudative age-related macular degeneration, bilateral, with inactive choroidal neovascularization: Secondary | ICD-10-CM | POA: Diagnosis not present

## 2015-11-06 DIAGNOSIS — H25813 Combined forms of age-related cataract, bilateral: Secondary | ICD-10-CM | POA: Diagnosis not present

## 2015-11-06 DIAGNOSIS — H5203 Hypermetropia, bilateral: Secondary | ICD-10-CM | POA: Diagnosis not present

## 2015-11-19 ENCOUNTER — Encounter: Payer: Self-pay | Admitting: Hematology & Oncology

## 2015-11-21 DIAGNOSIS — H353211 Exudative age-related macular degeneration, right eye, with active choroidal neovascularization: Secondary | ICD-10-CM | POA: Diagnosis not present

## 2015-12-05 ENCOUNTER — Other Ambulatory Visit: Payer: Self-pay | Admitting: Nurse Practitioner

## 2015-12-27 DIAGNOSIS — H353211 Exudative age-related macular degeneration, right eye, with active choroidal neovascularization: Secondary | ICD-10-CM | POA: Diagnosis not present

## 2015-12-27 DIAGNOSIS — H43813 Vitreous degeneration, bilateral: Secondary | ICD-10-CM | POA: Diagnosis not present

## 2015-12-27 DIAGNOSIS — H353221 Exudative age-related macular degeneration, left eye, with active choroidal neovascularization: Secondary | ICD-10-CM | POA: Diagnosis not present

## 2016-01-03 DIAGNOSIS — H353211 Exudative age-related macular degeneration, right eye, with active choroidal neovascularization: Secondary | ICD-10-CM | POA: Diagnosis not present

## 2016-01-14 ENCOUNTER — Ambulatory Visit (HOSPITAL_BASED_OUTPATIENT_CLINIC_OR_DEPARTMENT_OTHER): Payer: Medicare Other | Admitting: Hematology & Oncology

## 2016-01-14 ENCOUNTER — Other Ambulatory Visit (HOSPITAL_BASED_OUTPATIENT_CLINIC_OR_DEPARTMENT_OTHER): Payer: Medicare Other

## 2016-01-14 VITALS — BP 150/78 | HR 87 | Temp 97.4°F | Resp 20 | Wt 144.0 lb

## 2016-01-14 DIAGNOSIS — T386X5A Adverse effect of antigonadotrophins, antiestrogens, antiandrogens, not elsewhere classified, initial encounter: Secondary | ICD-10-CM

## 2016-01-14 DIAGNOSIS — C50911 Malignant neoplasm of unspecified site of right female breast: Secondary | ICD-10-CM

## 2016-01-14 DIAGNOSIS — C779 Secondary and unspecified malignant neoplasm of lymph node, unspecified: Secondary | ICD-10-CM

## 2016-01-14 DIAGNOSIS — C50211 Malignant neoplasm of upper-inner quadrant of right female breast: Secondary | ICD-10-CM

## 2016-01-14 DIAGNOSIS — M818 Other osteoporosis without current pathological fracture: Secondary | ICD-10-CM | POA: Diagnosis not present

## 2016-01-14 LAB — COMPREHENSIVE METABOLIC PANEL
ALBUMIN: 3.8 g/dL (ref 3.5–5.0)
ALK PHOS: 82 U/L (ref 40–150)
ALT: 17 U/L (ref 0–55)
AST: 25 U/L (ref 5–34)
Anion Gap: 10 mEq/L (ref 3–11)
BUN: 15.1 mg/dL (ref 7.0–26.0)
CHLORIDE: 103 meq/L (ref 98–109)
CO2: 27 mEq/L (ref 22–29)
Calcium: 9.4 mg/dL (ref 8.4–10.4)
Creatinine: 0.8 mg/dL (ref 0.6–1.1)
EGFR: 65 mL/min/{1.73_m2} — AB (ref >90)
GLUCOSE: 86 mg/dL (ref 70–140)
POTASSIUM: 4.2 meq/L (ref 3.5–5.1)
SODIUM: 140 meq/L (ref 136–145)
Total Bilirubin: 0.95 mg/dL (ref 0.20–1.20)
Total Protein: 6.7 g/dL (ref 6.4–8.3)

## 2016-01-14 LAB — CBC WITH DIFFERENTIAL (CANCER CENTER ONLY)
BASO#: 0.1 10*3/uL (ref 0.0–0.2)
BASO%: 1.1 % (ref 0.0–2.0)
EOS ABS: 0.1 10*3/uL (ref 0.0–0.5)
EOS%: 2.5 % (ref 0.0–7.0)
HCT: 40.9 % (ref 34.8–46.6)
HGB: 13.5 g/dL (ref 11.6–15.9)
LYMPH#: 0.9 10*3/uL (ref 0.9–3.3)
LYMPH%: 15.5 % (ref 14.0–48.0)
MCH: 29.5 pg (ref 26.0–34.0)
MCHC: 33 g/dL (ref 32.0–36.0)
MCV: 89 fL (ref 81–101)
MONO#: 0.6 10*3/uL (ref 0.1–0.9)
MONO%: 9.7 % (ref 0.0–13.0)
NEUT#: 4.1 10*3/uL (ref 1.5–6.5)
NEUT%: 71.2 % (ref 39.6–80.0)
Platelets: 168 10*3/uL (ref 145–400)
RBC: 4.58 10*6/uL (ref 3.70–5.32)
RDW: 13.6 % (ref 11.1–15.7)
WBC: 5.7 10*3/uL (ref 3.9–10.0)

## 2016-01-15 LAB — VITAMIN D 25 HYDROXY (VIT D DEFICIENCY, FRACTURES): VIT D 25 HYDROXY: 34.2 ng/mL (ref 30.0–100.0)

## 2016-01-15 NOTE — Progress Notes (Signed)
Hematology and Oncology Follow Up Visit  Regina Cardenas [id number] July 19, 1929 82 y.o. 01/15/2016   Principle Diagnosis:   Stage IIIA (T3N1M0) invasive ductal carcinoma of the right breast-ER positive/HER-2 negative  Current Therapy:    Status post Docetaxel/Cytoxan cycle #3  Neulasta 6 mg subcutaneous postchemotherapy  Radiation therapy to the right chest wall/axilla  Femara 2.5 mg by mouth daily  Prolia 60 mg subcutaneous every 6 months-start in November     Interim History:  Regina Cardenas is back for follow-up. She continues to improve. She looks tremendous. She really had a nice holiday season.  [name] exercising more. [name] more active. Her appetite is doing better. She did not have any problems with nausea or vomiting. There's been no issues with bowels or bladder.   [name] had no problems with arm swelling. [name] had no fevers sweats or chills.   [name] had some hot flashes but this appears be getting a little bit better.   There is no arthralgias from the Femara.   She is on vitamin D.   Overall, her performance status is ECOG 1. 1 .  Medications:  Current outpatient prescriptions:  .  Aflibercept (EYLEA IO), Inject into the eye. Every 4 weeks at eye MD, Disp: , Rfl:  .  aspirin 325 MG EC tablet, Take 325 mg by mouth 2 (two) times daily. , Disp: , Rfl:  .  cholecalciferol (VITAMIN D) 1000 UNITS tablet, Take 2,000 Units by mouth daily., Disp: , Rfl:  .  letrozole (FEMARA) 2.5 MG tablet, Take 1 tablet (2.5 mg total) by mouth daily., Disp: 90 tablet, Rfl: 3 .  Multiple Vitamin (MULTIVITAMIN) tablet, Take 1 tablet by mouth daily., Disp: , Rfl:  .  non-metallic deodorant (ALRA) MISC, Apply 1 application topically daily as needed., Disp: , Rfl:  .  tobramycin-dexamethasone (TOBRADEX) ophthalmic solution, Place 1 drop into the right eye every 4 (four) hours while awake. TAKES ONLY 3 DAYS A MONTH-- 2 DAYS BEFORE AND DAY OF EYE INJECTION, Disp: , Rfl:  .  Multiple  Vitamins-Minerals (PRESERVISION AREDS 2 PO), Take 2 tablets by mouth daily., Disp: , Rfl:  .  potassium chloride (KLOR-CON) 8 MEQ tablet, Take 1 tablet (8 mEq total) by mouth daily. (Patient not taking: Reported on 01/14/2016), Disp: 14 tablet, Rfl: 0  Allergies: No Known Allergies  Past Medical History, Surgical history, Social history, and Family History were reviewed and updated.  Review of Systems: As above  Physical Exam:  weight is 144 lb (65.318 kg). Her oral temperature is 97.4 F (36.3 C). Her blood pressure is 150/78 and her pulse is 87. Her respiration is 20.   Wt Readings from Last 3 Encounters:  01/14/16 144 lb (65.318 kg)  10/14/15 138 lb (62.596 kg)  10/03/15 142 lb 8 oz (64.638 kg)     [name] in no obvious distress. Head and neck exam shows no ocular or oral lesions. She has no palpable cervical or supraclavicular lymph nodes. Thyroid is none palpable. Lungs are clear bilaterally. Cardiac exam regular rate and rhythm with no murmurs, rubs or bruits. Abdomen is soft. She has good bowel sounds. There is no fluid wave. There is no palpable hepatosplenomegaly. Breast exam shows left breast with no masses, edema or erythema. There is no left axillary adenopathy. Right chest wall shows healing mastectomy. She has no erythema or nodularity along the right chest wall.. . Extremities shows no clubbing, cyanosis or edema. There may be some slight nonpitting edema of  the lower right arm. Skin exam shows no rashes, ecchymoses or petechia.  Lab Results  Component Value Date   WBC 5.7 01/14/2016   HGB 13.5 01/14/2016   HCT 40.9 01/14/2016   MCV 89 01/14/2016   PLT 168 01/14/2016     Chemistry      Component Value Date/Time   NA 140 01/14/2016 1153   NA 139 06/24/2015 1201   NA 140 02/20/2015 0625   K 4.2 01/14/2016 1153   K 3.6 06/24/2015 1201   K 4.5 02/20/2015 0625   CL 106 06/24/2015 1201   CL 107 02/20/2015 0625   CO2 27 01/14/2016 1153     CO2 28 06/24/2015 1201   CO2 29 02/20/2015 0625   BUN 15.1 01/14/2016 1153   BUN 12 06/24/2015 1201   BUN 10 02/20/2015 0625   CREATININE 0.8 01/14/2016 1153   CREATININE 1.0 06/24/2015 1201   CREATININE 0.81 02/20/2015 0625      Component Value Date/Time   CALCIUM 9.4 01/14/2016 1153   CALCIUM 8.9 06/24/2015 1201   CALCIUM 8.7 02/20/2015 0625   ALKPHOS 82 01/14/2016 1153   ALKPHOS 54 06/24/2015 1201   ALKPHOS 70 02/19/2015 1346   AST 25 01/14/2016 1153   AST 23 06/24/2015 1201   AST 29 02/19/2015 1346   ALT 17 01/14/2016 1153   ALT 12 06/24/2015 1201   ALT 17 02/19/2015 1346   BILITOT 0.95 01/14/2016 1153   BILITOT 1.00 06/24/2015 1201   BILITOT 1.2 02/19/2015 1346         Impression and Plan: Regina Cardenas is 82 year old postmenopausal white female. She has stage IIIa invasive ductal carcinoma of the right breast. She had a very large tumor. She had 2 lymph nodes that were positive. She had lymphovascular space invasion.  She looks better and better. Her hair is coming back nicely.  For right now, we get her back in about 3 months. We will do Prolia on her at that time. I think this will help with respect to decreasing her risk of recurrence.  I'm just grateful that she is doing as well as she is doing. She really had a miserable time with the treatment but she is come back nicely.   I spent about 25 minutes with her today.  Volanda Napoleon, MD 2/8/20172:50 PM

## 2016-02-05 DIAGNOSIS — H43813 Vitreous degeneration, bilateral: Secondary | ICD-10-CM | POA: Diagnosis not present

## 2016-02-05 DIAGNOSIS — H353221 Exudative age-related macular degeneration, left eye, with active choroidal neovascularization: Secondary | ICD-10-CM | POA: Diagnosis not present

## 2016-02-05 DIAGNOSIS — H353211 Exudative age-related macular degeneration, right eye, with active choroidal neovascularization: Secondary | ICD-10-CM | POA: Diagnosis not present

## 2016-02-14 MED FILL — LETROZOLE 2.5 MG TABLET: 2.5 | 90 days supply | Qty: 90 | Fill #2

## 2016-02-21 DIAGNOSIS — H353221 Exudative age-related macular degeneration, left eye, with active choroidal neovascularization: Secondary | ICD-10-CM | POA: Diagnosis not present

## 2016-03-11 DIAGNOSIS — H353211 Exudative age-related macular degeneration, right eye, with active choroidal neovascularization: Secondary | ICD-10-CM | POA: Diagnosis not present

## 2016-03-11 DIAGNOSIS — H353221 Exudative age-related macular degeneration, left eye, with active choroidal neovascularization: Secondary | ICD-10-CM | POA: Diagnosis not present

## 2016-03-11 DIAGNOSIS — H43813 Vitreous degeneration, bilateral: Secondary | ICD-10-CM | POA: Diagnosis not present

## 2016-03-13 ENCOUNTER — Telehealth: Payer: Self-pay | Admitting: *Deleted

## 2016-03-13 NOTE — Telephone Encounter (Signed)
Returning patient call from 03/12/16 - see previous phone encounter.  Reviewed with Dr Marin Olp and he would like patient to come in to be seen by the NP next week.   Called patient to schedule appointment and she states she wants to wait until her scheduled appointment in May. She states she feels better today and doesn't see the need to come in. She will call back if the symptoms worsen or she changes her mind.   Dr Marin Olp made aware of patient decision.

## 2016-03-13 NOTE — Telephone Encounter (Signed)
Call received at 430 pm on 4/6 with pt wanting to inform MD that she has developed tingling and numbness in bilateral hands and now developing in her left leg.  " I had that bone shot - so I pulled out the information and it said if you have these symptoms to call your doctor immediately "  This RN verified pt had shot in November of 2016. She has had no further injections or infusions since then.  This RN informed pt above not likely related to an injection she had in November.  Per further inquiry pt denies any noted weakness, slurred speech, visual changes or chest discomfort.  Hand symptoms developed approximately 1 month ago and leg symptoms about 1 week.  Per discussion with pt including her asking " did I call the right place?" with this RN informing pt she contacted the main cancer center but her call will be directed to Dr Marin Olp and his nurse for appropriate follow up.  Pt understands due to current time of call - she will not be called back until 4/7.  Best number given to reach pt is [phone].  This message will be forwarded to MD and nurse.

## 2016-04-14 ENCOUNTER — Encounter: Payer: Self-pay | Admitting: Hematology & Oncology

## 2016-04-14 ENCOUNTER — Other Ambulatory Visit (HOSPITAL_BASED_OUTPATIENT_CLINIC_OR_DEPARTMENT_OTHER): Payer: Medicare Other

## 2016-04-14 ENCOUNTER — Ambulatory Visit (HOSPITAL_BASED_OUTPATIENT_CLINIC_OR_DEPARTMENT_OTHER): Payer: Medicare Other

## 2016-04-14 ENCOUNTER — Ambulatory Visit (HOSPITAL_BASED_OUTPATIENT_CLINIC_OR_DEPARTMENT_OTHER): Payer: Medicare Other | Admitting: Hematology & Oncology

## 2016-04-14 VITALS — BP 150/71 | HR 81 | Temp 97.8°F | Resp 16 | Ht 63.0 in | Wt 146.0 lb

## 2016-04-14 DIAGNOSIS — M810 Age-related osteoporosis without current pathological fracture: Secondary | ICD-10-CM

## 2016-04-14 DIAGNOSIS — C779 Secondary and unspecified malignant neoplasm of lymph node, unspecified: Secondary | ICD-10-CM

## 2016-04-14 DIAGNOSIS — Z79811 Long term (current) use of aromatase inhibitors: Secondary | ICD-10-CM | POA: Diagnosis not present

## 2016-04-14 DIAGNOSIS — C50911 Malignant neoplasm of unspecified site of right female breast: Secondary | ICD-10-CM

## 2016-04-14 DIAGNOSIS — C50211 Malignant neoplasm of upper-inner quadrant of right female breast: Secondary | ICD-10-CM

## 2016-04-14 DIAGNOSIS — Z0000 Encounter for general adult medical examination without abnormal findings: Secondary | ICD-10-CM

## 2016-04-14 LAB — CBC WITH DIFFERENTIAL (CANCER CENTER ONLY)
BASO#: 0 10*3/uL (ref 0.0–0.2)
BASO%: 0.8 % (ref 0.0–2.0)
EOS%: 2.3 % (ref 0.0–7.0)
Eosinophils Absolute: 0.1 10*3/uL (ref 0.0–0.5)
HEMATOCRIT: 39.9 % (ref 34.8–46.6)
HEMOGLOBIN: 13.7 g/dL (ref 11.6–15.9)
LYMPH#: 0.8 10*3/uL — AB (ref 0.9–3.3)
LYMPH%: 17.3 % (ref 14.0–48.0)
MCH: 30.6 pg (ref 26.0–34.0)
MCHC: 34.3 g/dL (ref 32.0–36.0)
MCV: 89 fL (ref 81–101)
MONO#: 0.4 10*3/uL (ref 0.1–0.9)
MONO%: 9.1 % (ref 0.0–13.0)
NEUT%: 70.5 % (ref 39.6–80.0)
NEUTROS ABS: 3.4 10*3/uL (ref 1.5–6.5)
Platelets: 170 10*3/uL (ref 145–400)
RBC: 4.47 10*6/uL (ref 3.70–5.32)
RDW: 13.3 % (ref 11.1–15.7)
WBC: 4.9 10*3/uL (ref 3.9–10.0)

## 2016-04-14 LAB — CMP (CANCER CENTER ONLY)
ALBUMIN: 3.8 g/dL (ref 3.3–5.5)
ALK PHOS: 68 U/L (ref 26–84)
ALT: 19 U/L (ref 10–47)
AST: 28 U/L (ref 11–38)
BILIRUBIN TOTAL: 1.2 mg/dL (ref 0.20–1.60)
BUN, Bld: 13 mg/dL (ref 7–22)
CALCIUM: 9.6 mg/dL (ref 8.0–10.3)
CO2: 29 mEq/L (ref 18–33)
Chloride: 98 mEq/L (ref 98–108)
Creat: 1 mg/dl (ref 0.6–1.2)
GLUCOSE: 91 mg/dL (ref 73–118)
POTASSIUM: 4.3 meq/L (ref 3.3–4.7)
Sodium: 142 mEq/L (ref 128–145)
TOTAL PROTEIN: 6.4 g/dL (ref 6.4–8.1)

## 2016-04-14 MED ORDER — DENOSUMAB 60 MG/ML ~~LOC~~ SOLN
60.0000 mg | Freq: Once | SUBCUTANEOUS | Status: AC
  Administered 2016-04-14: 60 mg via SUBCUTANEOUS
  Filled 2016-04-14: qty 1

## 2016-04-14 NOTE — Patient Instructions (Signed)
Denosumab injection  What is this medicine?  DENOSUMAB (den oh sue mab) slows bone breakdown. Prolia is used to treat osteoporosis in women after menopause and in men. Xgeva is used to prevent bone fractures and other bone problems caused by cancer bone metastases. Xgeva is also used to treat giant cell tumor of the bone.  This medicine may be used for other purposes; ask your health care provider or pharmacist if you have questions.  What should I tell my health care provider before I take this medicine?  They need to know if you have any of these conditions:  -dental disease  -eczema  -infection or history of infections  -kidney disease or on dialysis  -low blood calcium or vitamin D  -malabsorption syndrome  -scheduled to have surgery or tooth extraction  -taking medicine that contains denosumab  -thyroid or parathyroid disease  -an unusual reaction to denosumab, other medicines, foods, dyes, or preservatives  -pregnant or trying to get pregnant  -breast-feeding  How should I use this medicine?  This medicine is for injection under the skin. It is given by a health care professional in a hospital or clinic setting.  If you are getting Prolia, a special MedGuide will be given to you by the pharmacist with each prescription and refill. Be sure to read this information carefully each time.  For Prolia, talk to your pediatrician regarding the use of this medicine in children. Special care may be needed. For Xgeva, talk to your pediatrician regarding the use of this medicine in children. While this drug may be prescribed for children as young as 13 years for selected conditions, precautions do apply.  Overdosage: If you think you have taken too much of this medicine contact a poison control center or emergency room at once.  NOTE: This medicine is only for you. Do not share this medicine with others.  What if I miss a dose?  It is important not to miss your dose. Call your doctor or health care professional if you are  unable to keep an appointment.  What may interact with this medicine?  Do not take this medicine with any of the following medications:  -other medicines containing denosumab  This medicine may also interact with the following medications:  -medicines that suppress the immune system  -medicines that treat cancer  -steroid medicines like prednisone or cortisone  This list may not describe all possible interactions. Give your health care provider a list of all the medicines, herbs, non-prescription drugs, or dietary supplements you use. Also tell them if you smoke, drink alcohol, or use illegal drugs. Some items may interact with your medicine.  What should I watch for while using this medicine?  Visit your doctor or health care professional for regular checks on your progress. Your doctor or health care professional may order blood tests and other tests to see how you are doing.  Call your doctor or health care professional if you get a cold or other infection while receiving this medicine. Do not treat yourself. This medicine may decrease your body's ability to fight infection.  You should make sure you get enough calcium and vitamin D while you are taking this medicine, unless your doctor tells you not to. Discuss the foods you eat and the vitamins you take with your health care professional.  See your dentist regularly. Brush and floss your teeth as directed. Before you have any dental work done, tell your dentist you are receiving this medicine.  Do   not become pregnant while taking this medicine or for 5 months after stopping it. Women should inform their doctor if they wish to become pregnant or think they might be pregnant. There is a potential for serious side effects to an unborn child. Talk to your health care professional or pharmacist for more information.  What side effects may I notice from receiving this medicine?  Side effects that you should report to your doctor or health care professional as soon as  possible:  -allergic reactions like skin rash, itching or hives, swelling of the face, lips, or tongue  -breathing problems  -chest pain  -fast, irregular heartbeat  -feeling faint or lightheaded, falls  -fever, chills, or any other sign of infection  -muscle spasms, tightening, or twitches  -numbness or tingling  -skin blisters or bumps, or is dry, peels, or red  -slow healing or unexplained pain in the mouth or jaw  -unusual bleeding or bruising  Side effects that usually do not require medical attention (Report these to your doctor or health care professional if they continue or are bothersome.):  -muscle pain  -stomach upset, gas  This list may not describe all possible side effects. Call your doctor for medical advice about side effects. You may report side effects to FDA at 1-800-FDA-1088.  Where should I keep my medicine?  This medicine is only given in a clinic, doctor's office, or other health care setting and will not be stored at home.  NOTE: This sheet is a summary. It may not cover all possible information. If you have questions about this medicine, talk to your doctor, pharmacist, or health care provider.      2016, Elsevier/Gold Standard. (2012-05-23 12:37:47)

## 2016-04-14 NOTE — Progress Notes (Signed)
Hematology and Oncology Follow Up Visit  Regina Cardenas [id number] [birth year] 82 y.o. 04/14/2016   Principle Diagnosis:   Stage IIIA (T3N1M0) invasive ductal carcinoma of the right breast-ER positive/HER-2 negative  Current Therapy:    Status post Docetaxel/Cytoxan cycle #3  Neulasta 6 mg subcutaneous postchemotherapy  Radiation therapy to the right chest wall/axilla  Femara 2.5 mg by mouth daily Prolia 60 mg subcutaneous every 8 -  dose today    Interim History:  Regina Cardenas is back for follow-up. She continues to improve. She looks tremendous. She really had a nice Easter. She really does not do all that much. She stays home a lot.   [name] exercising more. [name] more active. Her appetite is doing better. She did not have any problems with nausea or vomiting. There's been no issues with bowels or bladder.   [name] had no problems with arm swelling. [name] had no fevers sweats or chills.   [name] had some hot flashes but this appears be getting a little bit better.   There is no arthralgias from the Femara.   She is on vitamin D.   Overall, her performance status is ECOG 1. 1 .  Medications:  Current outpatient prescriptions:  .  Aflibercept (EYLEA IO), Inject into the eye. Every 4 weeks at eye MD, Disp: , Rfl:  .  aspirin 325 MG EC tablet, Take 325 mg by mouth 2 (two) times daily. , Disp: , Rfl:  .  cholecalciferol (VITAMIN D) 1000 UNITS tablet, Take 2,000 Units by mouth daily., Disp: , Rfl:  .  letrozole (FEMARA) 2.5 MG tablet, Take 1 tablet (2.5 mg total) by mouth daily., Disp: 90 tablet, Rfl: 3 .  Multiple Vitamin (MULTIVITAMIN) tablet, Take 1 tablet by mouth daily., Disp: , Rfl:  .  Multiple Vitamins-Minerals (PRESERVISION AREDS 2 PO), Take 2 tablets by mouth daily., Disp: , Rfl:  .  non-metallic deodorant (ALRA) MISC, Apply 1 application topically daily as needed., Disp: , Rfl:  .  tobramycin-dexamethasone (TOBRADEX) ophthalmic solution, Place 1 drop into the  right eye every 4 (four) hours while awake. TAKES ONLY 3 DAYS A MONTH-- 2 DAYS BEFORE AND DAY OF EYE INJECTION, Disp: , Rfl:  .  potassium chloride (KLOR-CON) 8 MEQ tablet, Take 1 tablet (8 mEq total) by mouth daily. (Patient not taking: Reported on 01/14/2016), Disp: 14 tablet, Rfl: 0  Allergies: No Known Allergies  Past Medical History, Surgical history, Social history, and Family History were reviewed and updated.  Review of Systems: As above  Physical Exam:  height is _0  (1.6 m) and weight is 146 lb (66.225 kg). Her oral temperature is 97.8 F (36.6 C). Her blood pressure is 150/71 and her pulse is 81. Her respiration is 16.   Wt Readings from Last 3 Encounters:  04/14/16 146 lb (66.225 kg)  01/14/16 144 lb (65.318 kg)  10/14/15 138 lb (62.596 kg)     [name] in no obvious distress. Head and neck exam shows no ocular or oral lesions. She has no palpable cervical or supraclavicular lymph nodes. Thyroid is none palpable. Lungs are clear bilaterally. Cardiac exam regular rate and rhythm with no murmurs, rubs or bruits. Abdomen is soft. She has good bowel sounds. There is no fluid wave. There is no palpable hepatosplenomegaly. Breast exam shows left breast with no masses, edema or erythema. There is no left axillary adenopathy. Right chest wall shows healing mastectomy. She has no erythema or nodularity along the right chest  wall.. . Extremities shows no clubbing, cyanosis or edema. There may be some slight nonpitting edema of the lower right arm. Skin exam shows no rashes, ecchymoses or petechia.  Lab Results  Component Value Date   WBC 4.9 04/14/2016   HGB 13.7 04/14/2016   HCT 39.9 04/14/2016   MCV 89 04/14/2016   PLT 170 04/14/2016     Chemistry      Component Value Date/Time   NA 142 04/14/2016 1202   NA 140 01/14/2016 1153   NA 140 02/20/2015 0625   K 4.3 04/14/2016 1202   K 4.2 01/14/2016 1153   K 4.5 02/20/2015 0625   CL 98 04/14/2016  1202   CL 107 02/20/2015 0625   CO2 29 04/14/2016 1202   CO2 27 01/14/2016 1153   CO2 29 02/20/2015 0625   BUN 13 04/14/2016 1202   BUN 15.1 01/14/2016 1153   BUN 10 02/20/2015 0625   CREATININE 1.0 04/14/2016 1202   CREATININE 0.8 01/14/2016 1153   CREATININE 0.81 02/20/2015 0625      Component Value Date/Time   CALCIUM 9.6 04/14/2016 1202   CALCIUM 9.4 01/14/2016 1153   CALCIUM 8.7 02/20/2015 0625   ALKPHOS 68 04/14/2016 1202   ALKPHOS 82 01/14/2016 1153   ALKPHOS 70 02/19/2015 1346   AST 28 04/14/2016 1202   AST 25 01/14/2016 1153   AST 29 02/19/2015 1346   ALT 19 04/14/2016 1202   ALT 17 01/14/2016 1153   ALT 17 02/19/2015 1346   BILITOT 1.20 04/14/2016 1202   BILITOT 0.95 01/14/2016 1153   BILITOT 1.2 02/19/2015 1346         Impression and Plan: Regina Cardenas is 82 year old postmenopausal white female. She has stage IIIa invasive ductal carcinoma of the right breast. She had a very large tumor. She had 2 lymph nodes that were positive. She had lymphovascular space invasion.  She looks better and better. Her hair is coming back nicely.  For right now, we get her back in about 4 months.   She will get Prolia today. In the future, we probably can do the Prolia in 8 months. I think this would be reasonable.   I spent about 25 minutes with her today.    Volanda Napoleon, MD 5/9/20171:16 PM

## 2016-04-15 DIAGNOSIS — H353211 Exudative age-related macular degeneration, right eye, with active choroidal neovascularization: Secondary | ICD-10-CM | POA: Diagnosis not present

## 2016-04-15 DIAGNOSIS — H43813 Vitreous degeneration, bilateral: Secondary | ICD-10-CM | POA: Diagnosis not present

## 2016-04-15 DIAGNOSIS — H353221 Exudative age-related macular degeneration, left eye, with active choroidal neovascularization: Secondary | ICD-10-CM | POA: Diagnosis not present

## 2016-04-30 IMAGING — MR MR BREAST BILATERAL W WO CONTRAST
6 of 13 series · 23 of 48 positions shown · IV contrast (multihance)
Comparison: Diagnostic mammogram and ultrasound dated 01/16/2015.

CLINICAL DATA: 85-year-old female who presented with a palpable
abnormality in the 10 o'clock region of the right breast,
spontaneous bloody nipple discharge and an erythematous right
nipple. Ultrasound-guided core biopsy was performed showing invasive
and in situ carcinoma in the 10 o'clock region of the breast.

LABS:  None obtained at the time of imaging.
EXAM:
BILATERAL BREAST MRI WITH AND WITHOUT CONTRAST
TECHNIQUE: Multiplanar, multisequence MR images of both breasts were obtained
prior to and following the intravenous administration of 15 ml of
Multihance.

[Series 2: T2 · axial · 3.0mm · 0.47mm/px · z∈[-79,+95]mm · 3 of 59 slices shown]
[im 1/59]
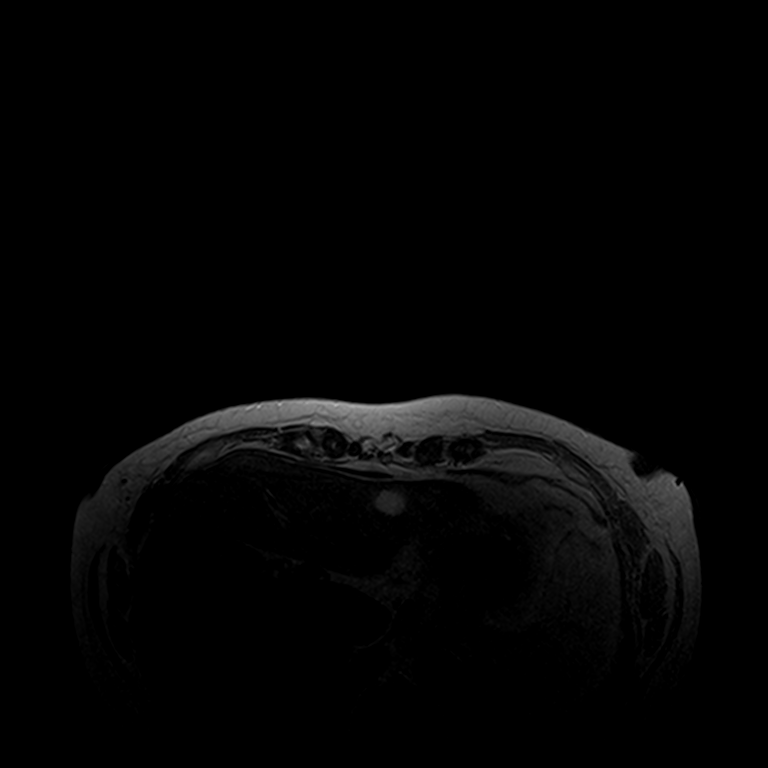
[im 30/59]
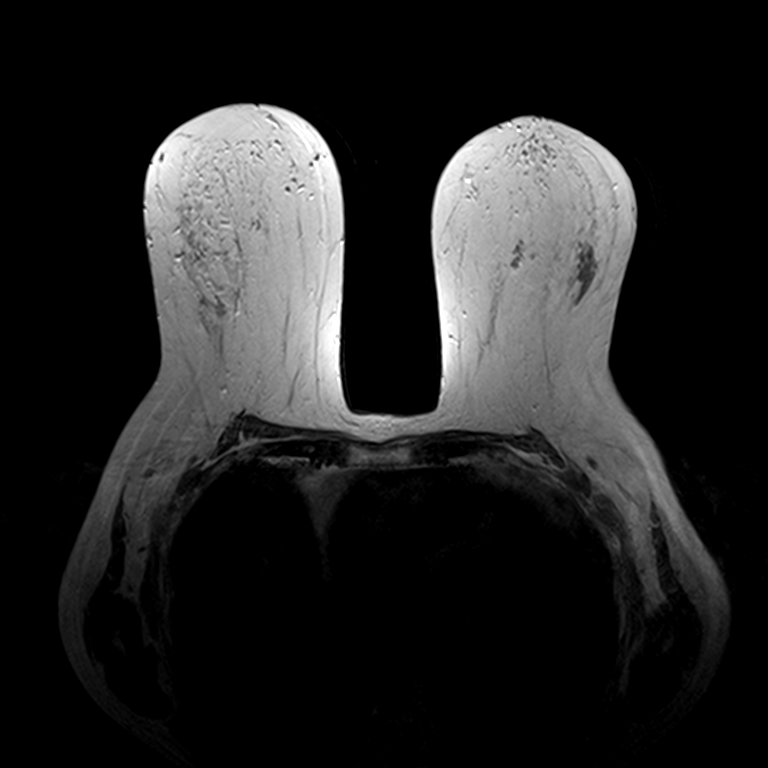
[im 59/59]
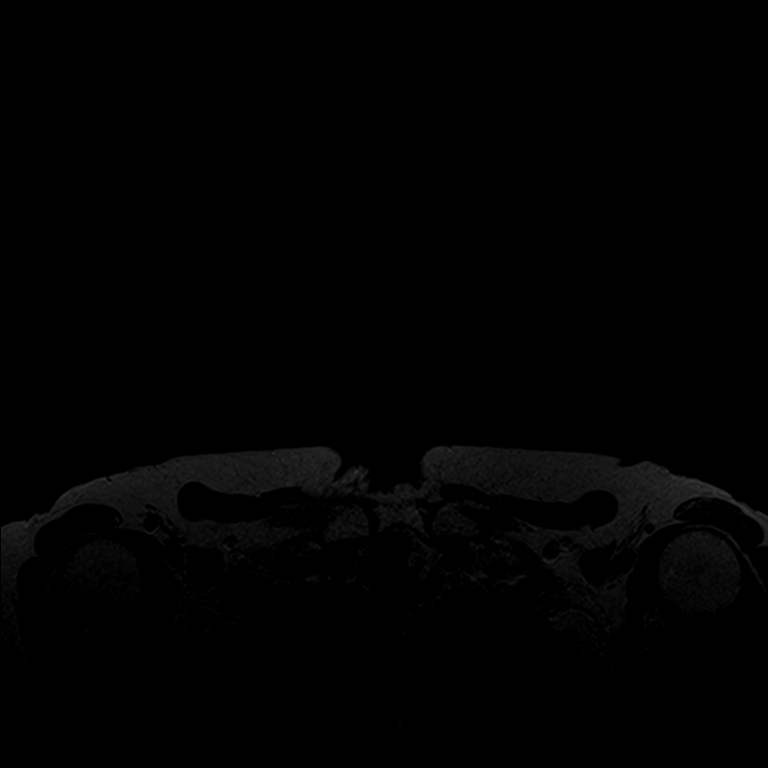

[Series 3: t2_tirm_tra ipat (a-p) · axial · 3.0mm · 0.70mm/px · z∈[-79,+95]mm · 2 of 59 slices shown]
[im 1/59]
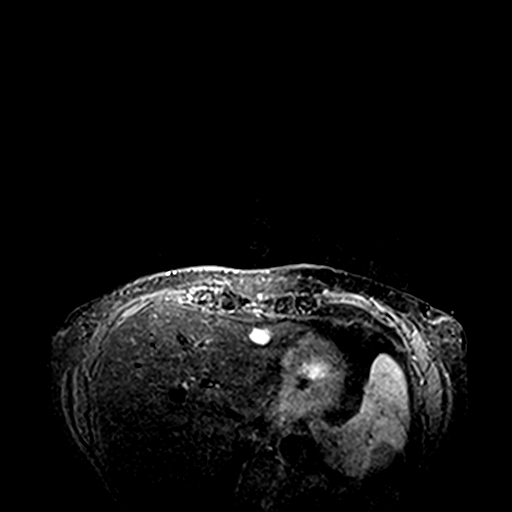
[im 59/59]
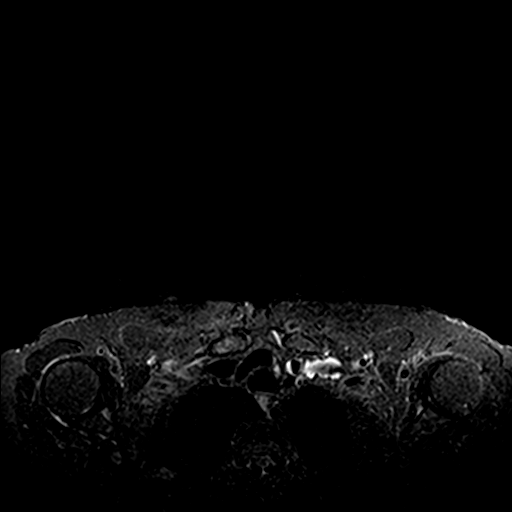

[Series 4: fl3d pre-cm no · axial · non-contrast · 1.2mm · 0.94mm/px · z∈[-87,+103]mm · 5 of 160 slices shown]
[im 1/160]
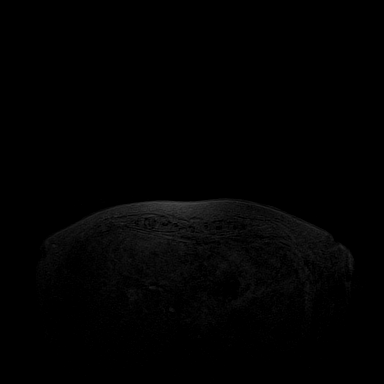
[im 40/160]
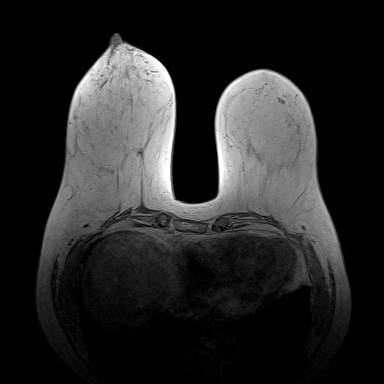
[im 80/160]
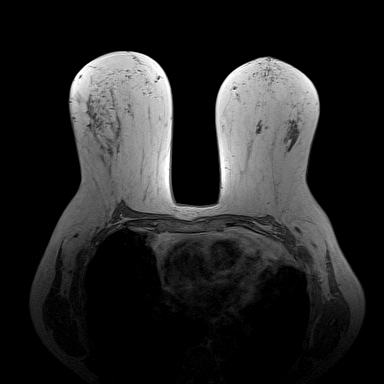
[im 120/160]
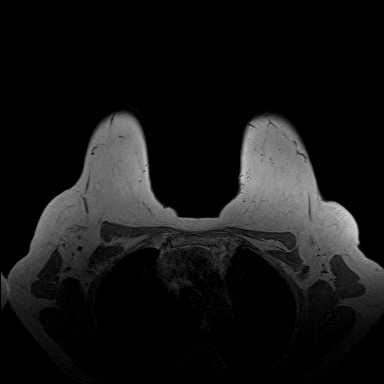
[im 160/160]
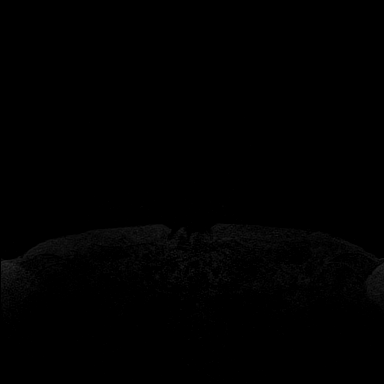

[Series 5: fl3d pre-cm · axial · non-contrast · 1.2mm · 0.94mm/px · z∈[-87,+103]mm · 5 of 160 slices shown]
[im 1/160]
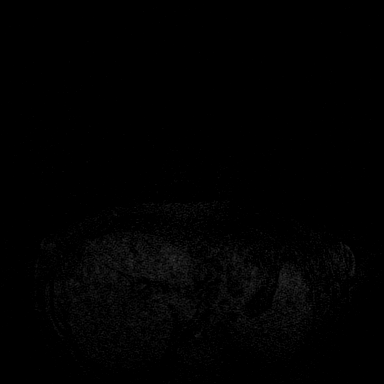
[im 40/160]
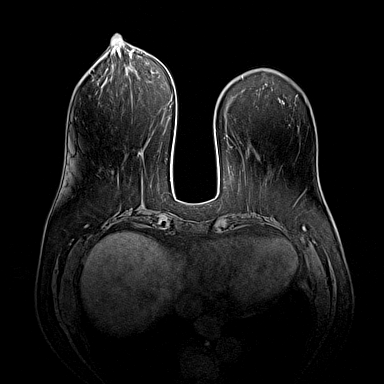
[im 80/160]
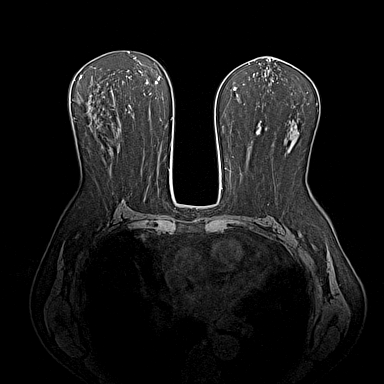
[im 120/160]
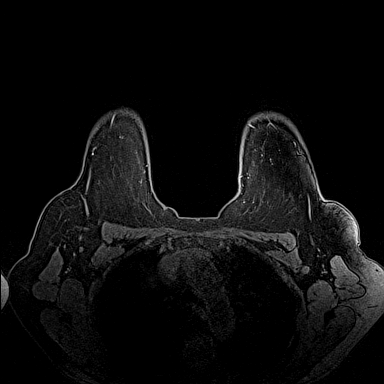
[im 160/160]
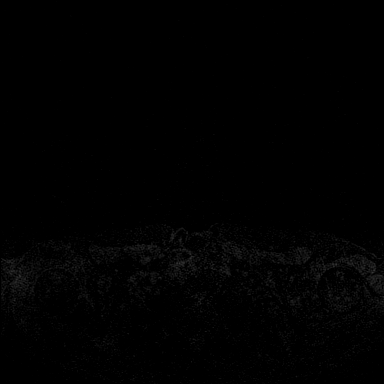

[Series 6: fl3d post immediate · axial · 1.2mm · 0.94mm/px · z∈[-87,+103]mm · 5 of 160 slices shown (1 of 2)]
[im 1/160]
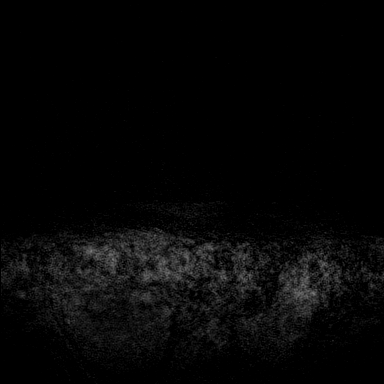
[im 40/160]
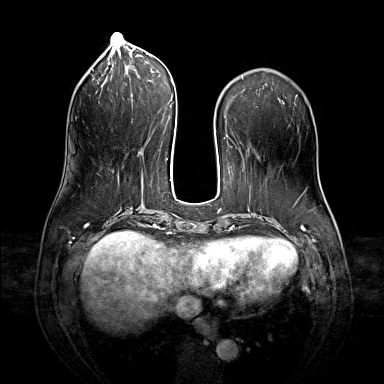
[im 80/160]
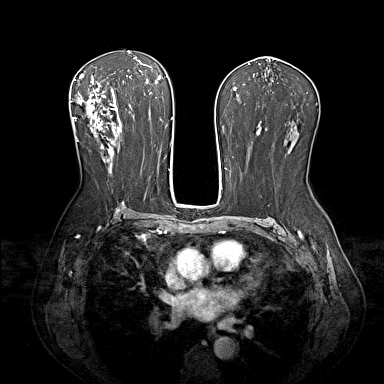
[im 120/160]
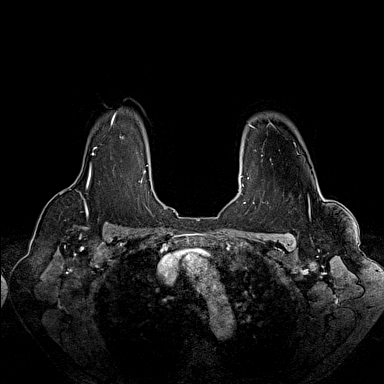
[im 160/160]
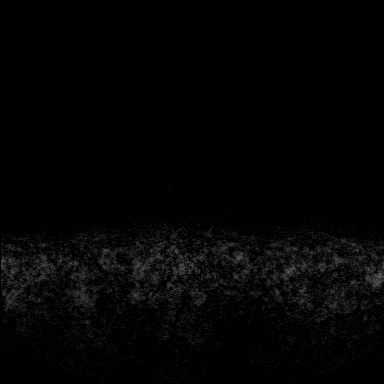

[Series 7: fl3d post immediate · axial · 1.2mm · 0.94mm/px · z∈[-87,+7]mm · 3 of 160 slices shown (2 of 2)]
[im 1/160]
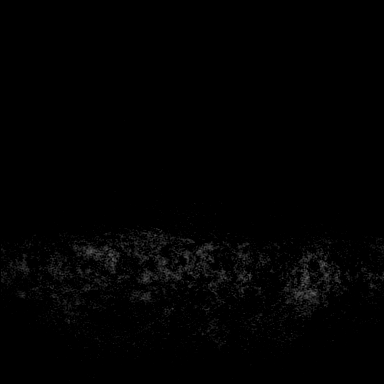
[im 40/160]
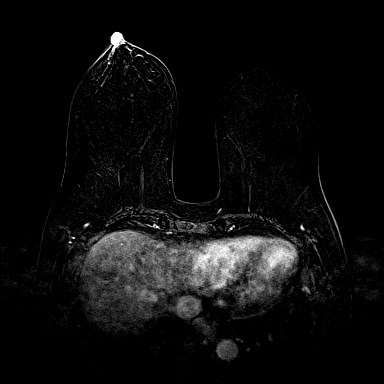
[im 80/160]
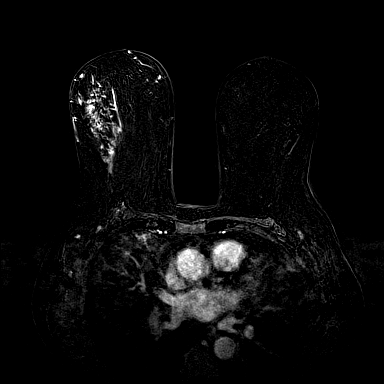

[23 of 48 positions shown; findings below may reference images not displayed]

THREE-DIMENSIONAL MR IMAGE RENDERING ON INDEPENDENT WORKSTATION:

Three-dimensional MR images were rendered by post-processing of the
original MR data on an independent workstation. The
three-dimensional MR images were interpreted, and findings are
reported in the following complete MRI report for this study. Three
dimensional images were evaluated at the independent [employer]
workstation
FINDINGS: Breast composition: b.  Scattered fibroglandular tissue.

Background parenchymal enhancement: Mild

Right breast: There is enhancement and thickening of the right
nipple concerning for Paget's disease. There is asymmetric regional
enhancement in the upper-outer quadrant of the right breast
measuring 11.6 cm anterior posteriorly, 4.6 cm transversely and
cm cranial caudally.

Left breast: No mass or abnormal enhancement.

Lymph nodes: No abnormal appearing lymph nodes.

Ancillary findings: Imaging through the upper abdomen shows an
indeterminate 1.4 cm lesion in the left hepatic lobe.
IMPRESSION: Enhancement of the right nipple concerning for Paget's disease.
cm of abnormal enhancement in the upper-outer quadrant of the right
breast consistent with invasive and ductal carcinoma in-situ.

RECOMMENDATION:
Treatment planning of the patient's known right breast invasive and
in situ carcinoma is recommended. If it is felt clinically necessary
to prove the extent of disease punch biopsy of the right nipple,
stereotactic biopsy of calcifications or sonographic biopsy of a
more anterior nodule could be performed.

Indeterminate 1.4 lesion in the left hepatic lobe of the liver.
Further evaluation with CT scan is recommended.

BI-RADS CATEGORY  6: Known biopsy-proven malignancy.

## 2016-05-10 IMAGING — US US ABDOMEN LIMITED
1 series · 14 of 25 positions shown · non-contrast
Comparison: none

CLINICAL DATA: Evaluate suspicious liver abnormalities seen on
breast MRI.

EXAM:
US ABDOMEN LIMITED - RIGHT UPPER QUADRANT

[Series 1: us abdomen limited · 0.16mm/px · 14 of 47 slices shown]
[im 1/47]
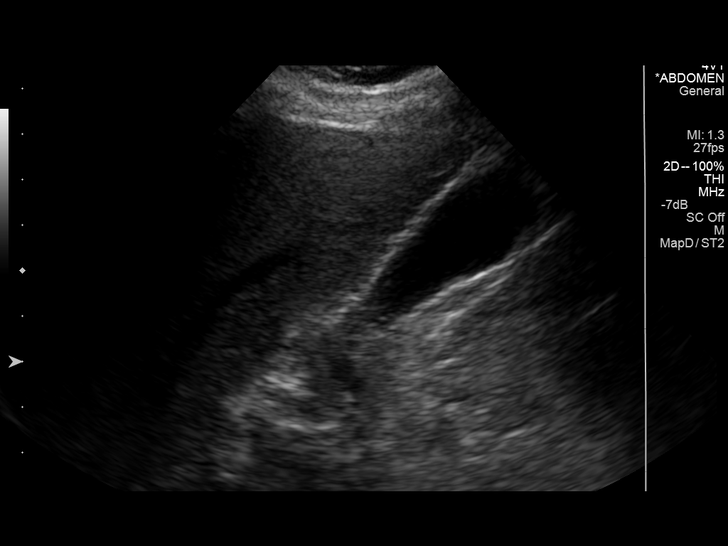
[im 4/47]
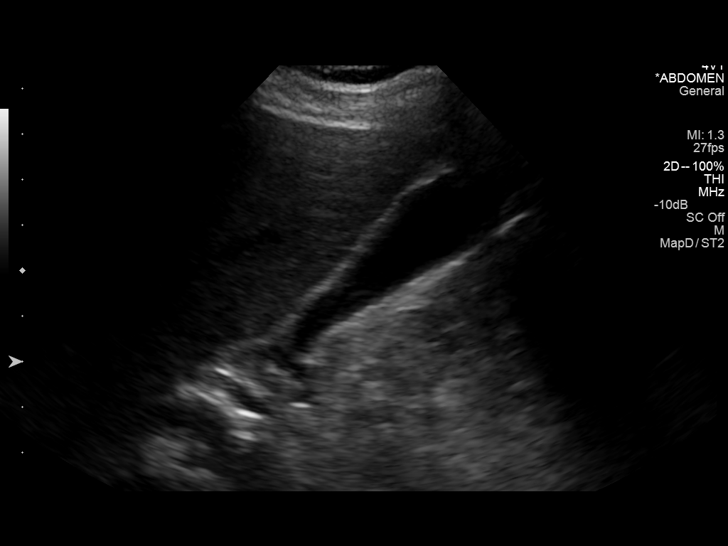
[im 8/47]
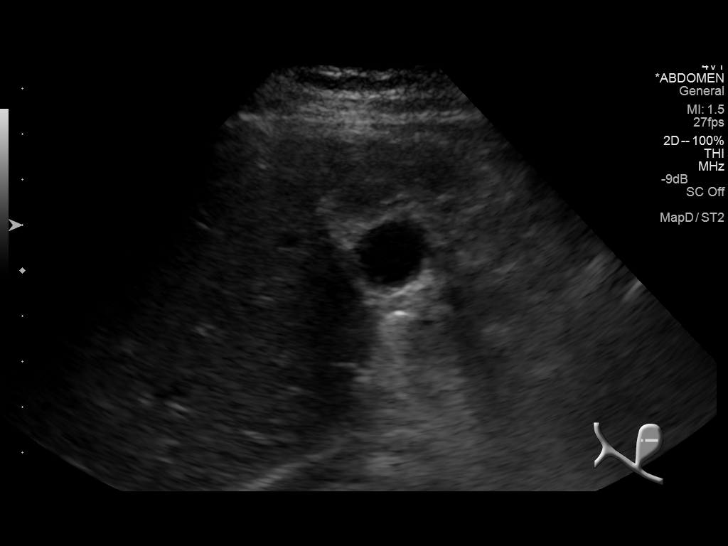
[im 12/47]
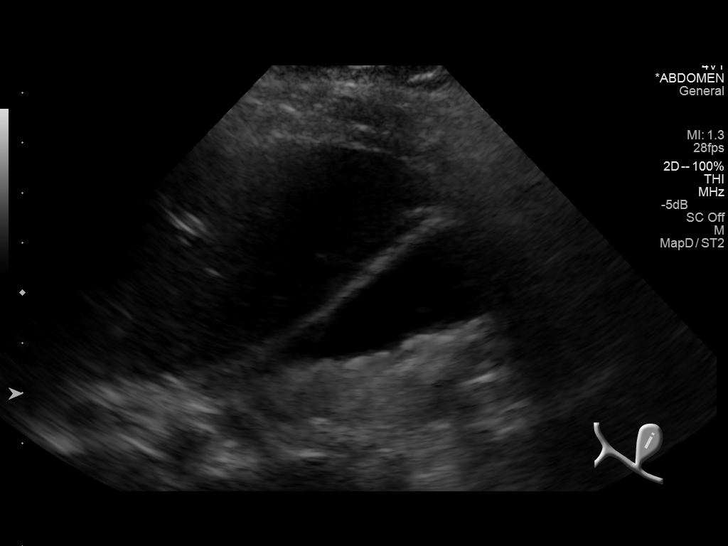
[im 16/47]
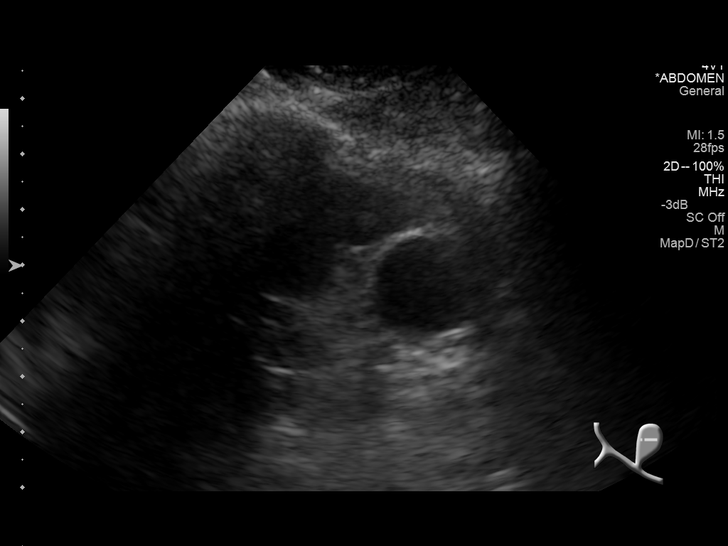
[im 18/47]
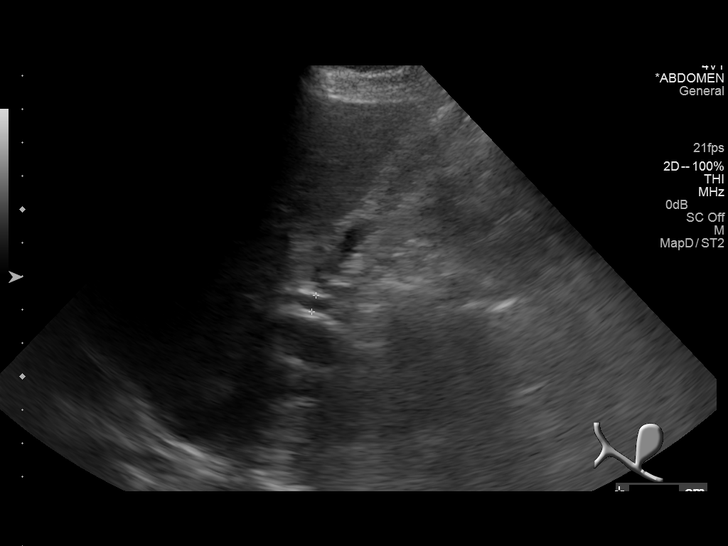
[im 22/47]
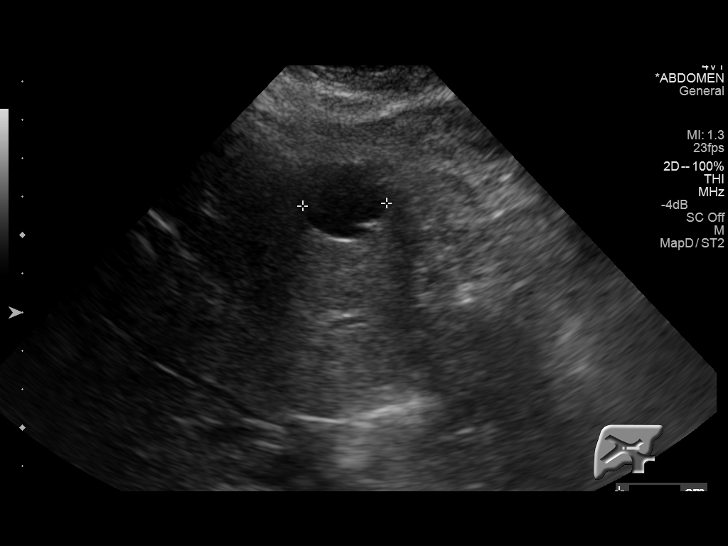
[im 25/47]
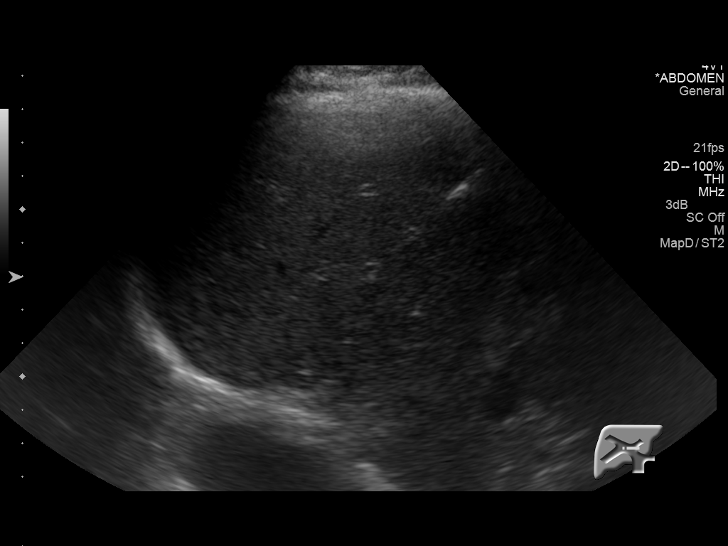
[im 29/47]
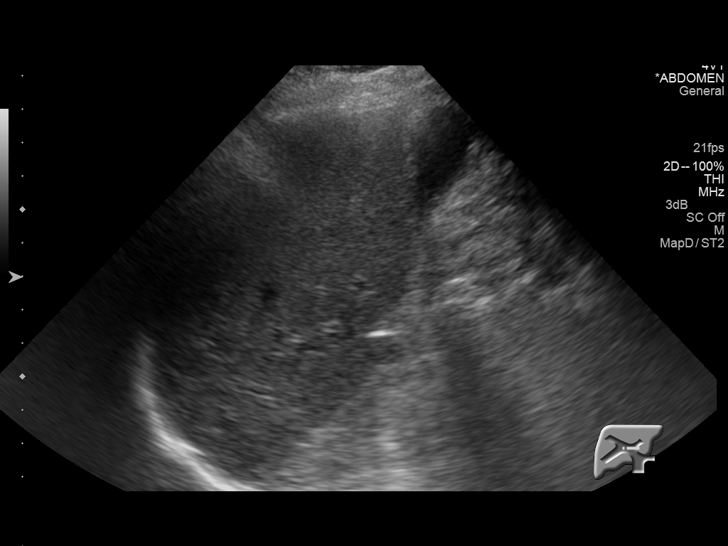
[im 31/47]
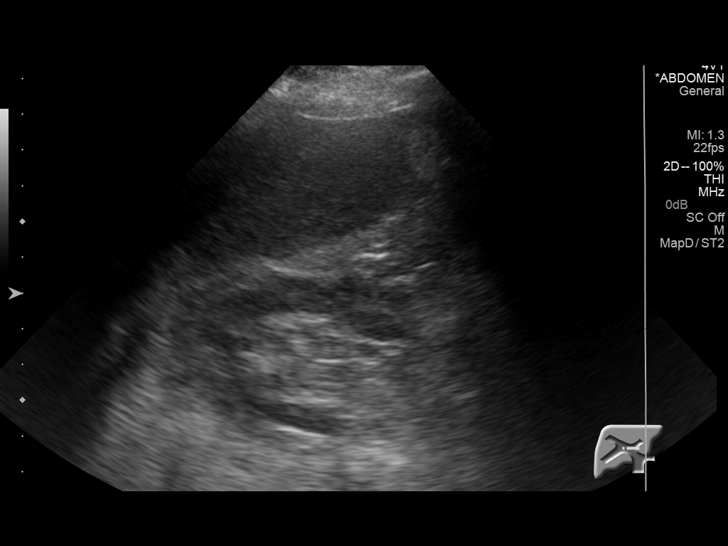
[im 35/47]
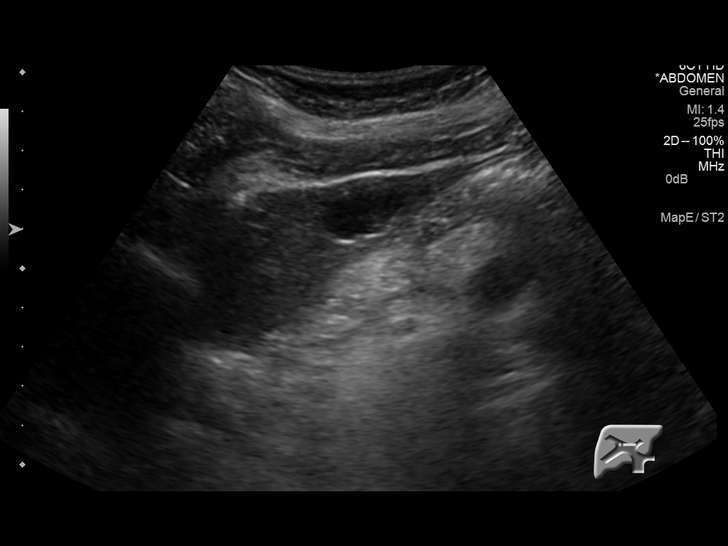
[im 39/47]
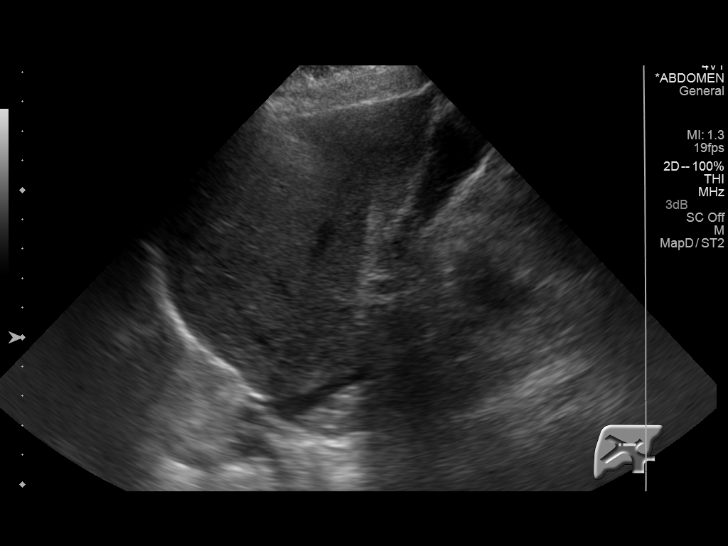
[im 43/47]
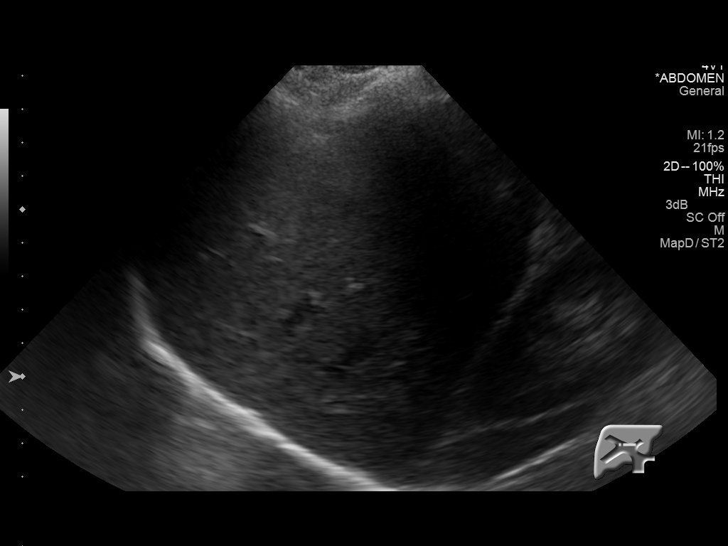
[im 47/47]
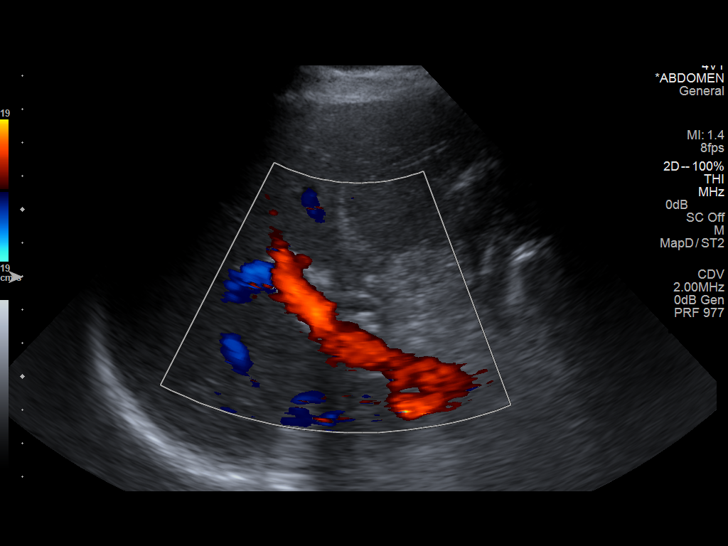

[14 of 25 positions shown; findings below may reference images not displayed]

FINDINGS: Gallbladder:

No gallstones or wall thickening visualized. No sonographic Murphy
sign noted.

Common bile duct:

Diameter: 5.1 mm.

Liver:

Well-circumscribed hypoechoic structure within the inferior right
hepatic lobe is identified. This measures 2.2 x 1.2 cm. The
parenchymal echogenicity appears normal.
IMPRESSION: Lesion within the lateral segment of left hepatic lobe corresponds
with the recent MR abnormality. Favored to represent a benign cyst.
A definitive assessment with contrast enhanced MRI or liver protocol
CT would be recommended given the new diagnosis of breast cancer.

## 2016-05-12 MED FILL — LETROZOLE 2.5 MG TABLET: 2.5 | 90 days supply | Qty: 90 | Fill #3

## 2016-05-14 DIAGNOSIS — H5203 Hypermetropia, bilateral: Secondary | ICD-10-CM | POA: Diagnosis not present

## 2016-05-14 DIAGNOSIS — H25813 Combined forms of age-related cataract, bilateral: Secondary | ICD-10-CM | POA: Diagnosis not present

## 2016-05-14 DIAGNOSIS — H04123 Dry eye syndrome of bilateral lacrimal glands: Secondary | ICD-10-CM | POA: Diagnosis not present

## 2016-05-20 DIAGNOSIS — H353211 Exudative age-related macular degeneration, right eye, with active choroidal neovascularization: Secondary | ICD-10-CM | POA: Diagnosis not present

## 2016-05-20 DIAGNOSIS — H353221 Exudative age-related macular degeneration, left eye, with active choroidal neovascularization: Secondary | ICD-10-CM | POA: Diagnosis not present

## 2016-05-20 DIAGNOSIS — H43813 Vitreous degeneration, bilateral: Secondary | ICD-10-CM | POA: Diagnosis not present

## 2016-06-22 DIAGNOSIS — H353211 Exudative age-related macular degeneration, right eye, with active choroidal neovascularization: Secondary | ICD-10-CM | POA: Diagnosis not present

## 2016-06-22 DIAGNOSIS — H43813 Vitreous degeneration, bilateral: Secondary | ICD-10-CM | POA: Diagnosis not present

## 2016-06-22 DIAGNOSIS — H353221 Exudative age-related macular degeneration, left eye, with active choroidal neovascularization: Secondary | ICD-10-CM | POA: Diagnosis not present

## 2016-07-02 DIAGNOSIS — H353221 Exudative age-related macular degeneration, left eye, with active choroidal neovascularization: Secondary | ICD-10-CM | POA: Diagnosis not present

## 2016-07-31 DIAGNOSIS — H353221 Exudative age-related macular degeneration, left eye, with active choroidal neovascularization: Secondary | ICD-10-CM | POA: Diagnosis not present

## 2016-07-31 DIAGNOSIS — H353211 Exudative age-related macular degeneration, right eye, with active choroidal neovascularization: Secondary | ICD-10-CM | POA: Diagnosis not present

## 2016-07-31 DIAGNOSIS — H43813 Vitreous degeneration, bilateral: Secondary | ICD-10-CM | POA: Diagnosis not present

## 2016-08-13 ENCOUNTER — Other Ambulatory Visit: Payer: Self-pay | Admitting: Hematology & Oncology

## 2016-08-13 ENCOUNTER — Other Ambulatory Visit (HOSPITAL_BASED_OUTPATIENT_CLINIC_OR_DEPARTMENT_OTHER): Payer: Medicare Other

## 2016-08-13 ENCOUNTER — Ambulatory Visit (HOSPITAL_BASED_OUTPATIENT_CLINIC_OR_DEPARTMENT_OTHER): Payer: Medicare Other | Admitting: Hematology & Oncology

## 2016-08-13 VITALS — BP 144/83 | HR 92 | Temp 98.0°F | Resp 18 | Wt 147.4 lb

## 2016-08-13 DIAGNOSIS — M810 Age-related osteoporosis without current pathological fracture: Secondary | ICD-10-CM | POA: Diagnosis not present

## 2016-08-13 DIAGNOSIS — E559 Vitamin D deficiency, unspecified: Secondary | ICD-10-CM

## 2016-08-13 DIAGNOSIS — C50911 Malignant neoplasm of unspecified site of right female breast: Secondary | ICD-10-CM | POA: Diagnosis not present

## 2016-08-13 DIAGNOSIS — C50211 Malignant neoplasm of upper-inner quadrant of right female breast: Secondary | ICD-10-CM

## 2016-08-13 DIAGNOSIS — C779 Secondary and unspecified malignant neoplasm of lymph node, unspecified: Secondary | ICD-10-CM | POA: Diagnosis not present

## 2016-08-13 DIAGNOSIS — Z0000 Encounter for general adult medical examination without abnormal findings: Secondary | ICD-10-CM | POA: Diagnosis not present

## 2016-08-13 LAB — COMPREHENSIVE METABOLIC PANEL
ALT: 12 U/L (ref 0–55)
ANION GAP: 8 meq/L (ref 3–11)
AST: 20 U/L (ref 5–34)
Albumin: 3.5 g/dL (ref 3.5–5.0)
Alkaline Phosphatase: 55 U/L (ref 40–150)
BUN: 13.5 mg/dL (ref 7.0–26.0)
CHLORIDE: 104 meq/L (ref 98–109)
CO2: 26 meq/L (ref 22–29)
CREATININE: 0.8 mg/dL (ref 0.6–1.1)
Calcium: 9.2 mg/dL (ref 8.4–10.4)
EGFR: 67 mL/min/{1.73_m2} — ABNORMAL LOW (ref >90)
Glucose: 84 mg/dl (ref 70–140)
POTASSIUM: 4.4 meq/L (ref 3.5–5.1)
Sodium: 138 mEq/L (ref 136–145)
Total Bilirubin: 0.73 mg/dL (ref 0.20–1.20)
Total Protein: 6.3 g/dL — ABNORMAL LOW (ref 6.4–8.3)

## 2016-08-13 LAB — CBC WITH DIFFERENTIAL (CANCER CENTER ONLY)
BASO#: 0.1 10*3/uL (ref 0.0–0.2)
BASO%: 1.2 % (ref 0.0–2.0)
EOS ABS: 0.1 10*3/uL (ref 0.0–0.5)
EOS%: 3 % (ref 0.0–7.0)
HCT: 39.6 % (ref 34.8–46.6)
HGB: 13.7 g/dL (ref 11.6–15.9)
LYMPH#: 0.8 10*3/uL — ABNORMAL LOW (ref 0.9–3.3)
LYMPH%: 18.2 % (ref 14.0–48.0)
MCH: 30.4 pg (ref 26.0–34.0)
MCHC: 34.6 g/dL (ref 32.0–36.0)
MCV: 88 fL (ref 81–101)
MONO#: 0.4 10*3/uL (ref 0.1–0.9)
MONO%: 9.8 % (ref 0.0–13.0)
NEUT#: 2.9 10*3/uL (ref 1.5–6.5)
NEUT%: 67.8 % (ref 39.6–80.0)
PLATELETS: 147 10*3/uL (ref 145–400)
RBC: 4.5 10*6/uL (ref 3.70–5.32)
RDW: 13 % (ref 11.1–15.7)
WBC: 4.3 10*3/uL (ref 3.9–10.0)

## 2016-08-13 LAB — LACTATE DEHYDROGENASE: LDH: 177 U/L (ref 125–245)

## 2016-08-13 MED FILL — LETROZOLE 2.5 MG TABLET: 2.5 | 90 days supply | Qty: 90 | Fill #0

## 2016-08-13 NOTE — Progress Notes (Signed)
Hematology and Oncology Follow Up Visit  Regina Cardenas [id number] Nov 30, 1929 82 y.o. 08/13/2016   Principle Diagnosis:   Stage IIIA (T3N1M0) invasive ductal carcinoma of the right breast-ER positive/HER-2 negative  Current Therapy:    Status post Docetaxel/Cytoxan cycle #3  Neulasta 6 mg subcutaneous postchemotherapy  Radiation therapy to the right chest wall/axilla  Femara 2.5 mg by mouth daily Prolia 60 mg subcutaneous every 8 -  dose today    Interim History:  Regina Cardenas is back for follow-up. She continues to improve. She looks tremendous. She really had a nice summer. She had a very quiet Labor Day holiday weekend. She really does not do all that much. She stays home a lot.   [name] exercising more. [name] more active. Her appetite is doing better. She did not have any problems with nausea or vomiting. There's been no issues with bowels or bladder.   [name] had no problems with arm swelling. [name] had no fevers sweats or chills.   [name] had some hot flashes but this appears be getting a little bit better.   There is no arthralgias from the Femara.   She is on vitamin D.   Overall, her performance status is ECOG 1. .  Medications:  Current Outpatient Prescriptions:  .  Aflibercept (EYLEA IO), Inject into the eye. Every 4 weeks at eye MD, Disp: , Rfl:  .  aspirin 325 MG EC tablet, Take 325 mg by mouth 2 (two) times daily. , Disp: , Rfl:  .  cholecalciferol (VITAMIN D) 1000 UNITS tablet, Take 2,000 Units by mouth daily., Disp: , Rfl:  .  letrozole (FEMARA) 2.5 MG tablet, TAKE 1 TABLET BY MOUTH DAILY, Disp: 90 tablet, Rfl: 3 .  Multiple Vitamin (MULTIVITAMIN) tablet, Take 1 tablet by mouth daily., Disp: , Rfl:  .  Multiple Vitamins-Minerals (PRESERVISION AREDS 2 PO), Take 2 tablets by mouth daily., Disp: , Rfl:  .  non-metallic deodorant (ALRA) MISC, Apply 1 application topically daily as needed., Disp: , Rfl:  .  potassium chloride (KLOR-CON) 8 MEQ tablet, Take 1  tablet (8 mEq total) by mouth daily. (Patient not taking: Reported on 01/14/2016), Disp: 14 tablet, Rfl: 0 .  tobramycin-dexamethasone (TOBRADEX) ophthalmic solution, Place 1 drop into the right eye every 4 (four) hours while awake. TAKES ONLY 3 DAYS A MONTH-- 2 DAYS BEFORE AND DAY OF EYE INJECTION, Disp: , Rfl:   Allergies: No Known Allergies  Past Medical History, Surgical history, Social history, and Family History were reviewed and updated.  Review of Systems: As above  Physical Exam:  weight is 147 lb 6.4 oz (66.9 kg). Her oral temperature is 98 F (36.7 C). Her blood pressure is 144/83 (abnormal) and her pulse is 92. Her respiration is 18.   Wt Readings from Last 3 Encounters:  08/13/16 147 lb 6.4 oz (66.9 kg)  04/14/16 146 lb (66.2 kg)  01/14/16 144 lb (65.3 kg)     [name] in no obvious distress. Head and neck exam shows no ocular or oral lesions. She has no palpable cervical or supraclavicular lymph nodes. Thyroid is none palpable. Lungs are clear bilaterally. Cardiac exam regular rate and rhythm with no murmurs, rubs or bruits. Abdomen is soft. She has good bowel sounds. There is no fluid wave. There is no palpable hepatosplenomegaly. Breast exam shows left breast with no masses, edema or erythema. There is no left axillary adenopathy. Right chest wall shows healing mastectomy. She has no erythema or nodularity along  the right chest wall.. . Extremities shows no clubbing, cyanosis or edema. There may be some slight nonpitting edema of the lower right arm. Skin exam shows no rashes, ecchymoses or petechia.  Lab Results  Component Value Date   WBC 4.3 08/13/2016   HGB 13.7 08/13/2016   HCT 39.6 08/13/2016   MCV 88 08/13/2016   PLT 147 08/13/2016     Chemistry      Component Value Date/Time   NA 138 08/13/2016 1141   K 4.4 08/13/2016 1141   CL 98 04/14/2016 1202   CO2 26 08/13/2016 1141   BUN 13.5 08/13/2016 1141   CREATININE 0.8 08/13/2016  1141      Component Value Date/Time   CALCIUM 9.2 08/13/2016 1141   ALKPHOS 55 08/13/2016 1141   AST 20 08/13/2016 1141   ALT 12 08/13/2016 1141   BILITOT 0.73 08/13/2016 1141         Impression and Plan: Regina Cardenas is 82 year old postmenopausal white female. She has stage IIIa invasive ductal carcinoma of the right breast. She had a very large tumor. She had 2 lymph nodes that were positive. She had lymphovascular space invasion.  She looks better and better. Her hair is coming back nicely. It is very thick.  I would like to see her back in 4 more months. At that point, we will give her another dose of Prolia.  I'm just confident that she has a good prognosis. She is done everything we have asked her to do. She had a really hard time with treatment. However, I just think that her out, she still be good.  I spent about 25 minutes with her today.    Volanda Napoleon, MD 9/7/20171:54 PM

## 2016-08-14 ENCOUNTER — Telehealth: Payer: Self-pay

## 2016-08-14 LAB — VITAMIN D 25 HYDROXY (VIT D DEFICIENCY, FRACTURES): Vitamin D, 25-Hydroxy: 35.3 ng/mL (ref 30.0–100.0)

## 2016-08-14 NOTE — Telephone Encounter (Addendum)
-----   Message from Volanda Napoleon, MD sent at 08/14/2016  8:41 AM EDT ----- Call - vit D is perfect!!  Regina Cardenas   Attempted to contact pt x 2. Both times pt said "Hello" then hung up. Will continue to attempt. dph

## 2016-09-03 DIAGNOSIS — H353221 Exudative age-related macular degeneration, left eye, with active choroidal neovascularization: Secondary | ICD-10-CM | POA: Diagnosis not present

## 2016-09-03 DIAGNOSIS — H353211 Exudative age-related macular degeneration, right eye, with active choroidal neovascularization: Secondary | ICD-10-CM | POA: Diagnosis not present

## 2016-09-03 DIAGNOSIS — H43813 Vitreous degeneration, bilateral: Secondary | ICD-10-CM | POA: Diagnosis not present

## 2016-09-10 DIAGNOSIS — H353232 Exudative age-related macular degeneration, bilateral, with inactive choroidal neovascularization: Secondary | ICD-10-CM | POA: Diagnosis not present

## 2016-09-10 DIAGNOSIS — H25813 Combined forms of age-related cataract, bilateral: Secondary | ICD-10-CM | POA: Diagnosis not present

## 2016-09-10 DIAGNOSIS — H43813 Vitreous degeneration, bilateral: Secondary | ICD-10-CM | POA: Diagnosis not present

## 2016-10-02 ENCOUNTER — Other Ambulatory Visit: Payer: Self-pay | Admitting: Family

## 2016-10-02 DIAGNOSIS — T386X5A Adverse effect of antigonadotrophins, antiestrogens, antiandrogens, not elsewhere classified, initial encounter: Principal | ICD-10-CM

## 2016-10-02 DIAGNOSIS — M818 Other osteoporosis without current pathological fracture: Secondary | ICD-10-CM | POA: Insufficient documentation

## 2016-10-09 DIAGNOSIS — H353221 Exudative age-related macular degeneration, left eye, with active choroidal neovascularization: Secondary | ICD-10-CM | POA: Diagnosis not present

## 2016-10-09 DIAGNOSIS — H43813 Vitreous degeneration, bilateral: Secondary | ICD-10-CM | POA: Diagnosis not present

## 2016-10-09 DIAGNOSIS — H353211 Exudative age-related macular degeneration, right eye, with active choroidal neovascularization: Secondary | ICD-10-CM | POA: Diagnosis not present

## 2016-10-27 DIAGNOSIS — H25811 Combined forms of age-related cataract, right eye: Secondary | ICD-10-CM | POA: Diagnosis not present

## 2016-10-27 DIAGNOSIS — H21561 Pupillary abnormality, right eye: Secondary | ICD-10-CM | POA: Diagnosis not present

## 2016-10-27 DIAGNOSIS — H268 Other specified cataract: Secondary | ICD-10-CM | POA: Diagnosis not present

## 2016-11-02 MED FILL — LETROZOLE 2.5 MG TABLET: 2.5 | 90 days supply | Qty: 90 | Fill #1

## 2016-11-10 DIAGNOSIS — H353231 Exudative age-related macular degeneration, bilateral, with active choroidal neovascularization: Secondary | ICD-10-CM | POA: Diagnosis not present

## 2016-11-10 DIAGNOSIS — H43813 Vitreous degeneration, bilateral: Secondary | ICD-10-CM | POA: Diagnosis not present

## 2016-11-18 DIAGNOSIS — H353231 Exudative age-related macular degeneration, bilateral, with active choroidal neovascularization: Secondary | ICD-10-CM | POA: Diagnosis not present

## 2016-12-16 DIAGNOSIS — H353231 Exudative age-related macular degeneration, bilateral, with active choroidal neovascularization: Secondary | ICD-10-CM | POA: Diagnosis not present

## 2016-12-16 DIAGNOSIS — H43813 Vitreous degeneration, bilateral: Secondary | ICD-10-CM | POA: Diagnosis not present

## 2016-12-17 ENCOUNTER — Ambulatory Visit (HOSPITAL_BASED_OUTPATIENT_CLINIC_OR_DEPARTMENT_OTHER): Payer: Medicare Other

## 2016-12-17 ENCOUNTER — Other Ambulatory Visit (HOSPITAL_BASED_OUTPATIENT_CLINIC_OR_DEPARTMENT_OTHER): Payer: Medicare Other

## 2016-12-17 ENCOUNTER — Ambulatory Visit (HOSPITAL_BASED_OUTPATIENT_CLINIC_OR_DEPARTMENT_OTHER): Payer: Medicare Other | Admitting: Hematology & Oncology

## 2016-12-17 VITALS — BP 143/78 | HR 84 | Temp 98.2°F | Resp 16 | Wt 150.5 lb

## 2016-12-17 DIAGNOSIS — M818 Other osteoporosis without current pathological fracture: Secondary | ICD-10-CM

## 2016-12-17 DIAGNOSIS — R222 Localized swelling, mass and lump, trunk: Secondary | ICD-10-CM

## 2016-12-17 DIAGNOSIS — Z9011 Acquired absence of right breast and nipple: Secondary | ICD-10-CM | POA: Diagnosis not present

## 2016-12-17 DIAGNOSIS — Z79811 Long term (current) use of aromatase inhibitors: Secondary | ICD-10-CM

## 2016-12-17 DIAGNOSIS — C779 Secondary and unspecified malignant neoplasm of lymph node, unspecified: Secondary | ICD-10-CM

## 2016-12-17 DIAGNOSIS — C50911 Malignant neoplasm of unspecified site of right female breast: Secondary | ICD-10-CM | POA: Diagnosis not present

## 2016-12-17 DIAGNOSIS — C50211 Malignant neoplasm of upper-inner quadrant of right female breast: Secondary | ICD-10-CM

## 2016-12-17 DIAGNOSIS — Z170 Estrogen receptor positive status [ER+]: Secondary | ICD-10-CM

## 2016-12-17 DIAGNOSIS — T386X5A Adverse effect of antigonadotrophins, antiestrogens, antiandrogens, not elsewhere classified, initial encounter: Principal | ICD-10-CM

## 2016-12-17 LAB — CBC WITH DIFFERENTIAL (CANCER CENTER ONLY)
BASO#: 0.1 10*3/uL (ref 0.0–0.2)
BASO%: 1 % (ref 0.0–2.0)
EOS ABS: 0.1 10*3/uL (ref 0.0–0.5)
EOS%: 2.8 % (ref 0.0–7.0)
HEMATOCRIT: 42.4 % (ref 34.8–46.6)
HEMOGLOBIN: 14.3 g/dL (ref 11.6–15.9)
LYMPH#: 1 10*3/uL (ref 0.9–3.3)
LYMPH%: 20.6 % (ref 14.0–48.0)
MCH: 30.2 pg (ref 26.0–34.0)
MCHC: 33.7 g/dL (ref 32.0–36.0)
MCV: 90 fL (ref 81–101)
MONO#: 0.5 10*3/uL (ref 0.1–0.9)
MONO%: 9.8 % (ref 0.0–13.0)
NEUT%: 65.8 % (ref 39.6–80.0)
NEUTROS ABS: 3.3 10*3/uL (ref 1.5–6.5)
Platelets: 133 10*3/uL — ABNORMAL LOW (ref 145–400)
RBC: 4.73 10*6/uL (ref 3.70–5.32)
RDW: 13.2 % (ref 11.1–15.7)
WBC: 5 10*3/uL (ref 3.9–10.0)

## 2016-12-17 LAB — CMP (CANCER CENTER ONLY)
ALBUMIN: 4.1 g/dL (ref 3.3–5.5)
ALT(SGPT): 20 U/L (ref 10–47)
AST: 28 U/L (ref 11–38)
Alkaline Phosphatase: 58 U/L (ref 26–84)
BILIRUBIN TOTAL: 1.3 mg/dL (ref 0.20–1.60)
BUN, Bld: 10 mg/dL (ref 7–22)
CALCIUM: 9.5 mg/dL (ref 8.0–10.3)
CHLORIDE: 100 meq/L (ref 98–108)
CO2: 29 meq/L (ref 18–33)
Creat: 0.7 mg/dl (ref 0.6–1.2)
Glucose, Bld: 86 mg/dL (ref 73–118)
Potassium: 3.9 mEq/L (ref 3.3–4.7)
Sodium: 140 mEq/L (ref 128–145)
Total Protein: 6.4 g/dL (ref 6.4–8.1)

## 2016-12-17 MED ORDER — DENOSUMAB 60 MG/ML ~~LOC~~ SOLN
60.0000 mg | Freq: Once | SUBCUTANEOUS | Status: AC
  Administered 2016-12-17: 60 mg via SUBCUTANEOUS
  Filled 2016-12-17: qty 1

## 2016-12-17 NOTE — Patient Instructions (Signed)
Denosumab injection What is this medicine? DENOSUMAB (den oh sue mab) slows bone breakdown. Prolia is used to treat osteoporosis in women after menopause and in men. Xgeva is used to prevent bone fractures and other bone problems caused by cancer bone metastases. Xgeva is also used to treat giant cell tumor of the bone. COMMON BRAND NAME(S): Prolia, XGEVA What should I tell my health care provider before I take this medicine? They need to know if you have any of these conditions: -dental disease -eczema -infection or history of infections -kidney disease or on dialysis -low blood calcium or vitamin D -malabsorption syndrome -scheduled to have surgery or tooth extraction -taking medicine that contains denosumab -thyroid or parathyroid disease -an unusual reaction to denosumab, other medicines, foods, dyes, or preservatives -pregnant or trying to get pregnant -breast-feeding How should I use this medicine? This medicine is for injection under the skin. It is given by a health care professional in a hospital or clinic setting. If you are getting Prolia, a special MedGuide will be given to you by the pharmacist with each prescription and refill. Be sure to read this information carefully each time. For Prolia, talk to your pediatrician regarding the use of this medicine in children. Special care may be needed. For Xgeva, talk to your pediatrician regarding the use of this medicine in children. While this drug may be prescribed for children as young as 13 years for selected conditions, precautions do apply. What if I miss a dose? It is important not to miss your dose. Call your doctor or health care professional if you are unable to keep an appointment. What may interact with this medicine? Do not take this medicine with any of the following medications: -other medicines containing denosumab This medicine may also interact with the following medications: -medicines that suppress the immune  system -medicines that treat cancer -steroid medicines like prednisone or cortisone What should I watch for while using this medicine? Visit your doctor or health care professional for regular checks on your progress. Your doctor or health care professional may order blood tests and other tests to see how you are doing. Call your doctor or health care professional if you get a cold or other infection while receiving this medicine. Do not treat yourself. This medicine may decrease your body's ability to fight infection. You should make sure you get enough calcium and vitamin D while you are taking this medicine, unless your doctor tells you not to. Discuss the foods you eat and the vitamins you take with your health care professional. See your dentist regularly. Brush and floss your teeth as directed. Before you have any dental work done, tell your dentist you are receiving this medicine. Do not become pregnant while taking this medicine or for 5 months after stopping it. Women should inform their doctor if they wish to become pregnant or think they might be pregnant. There is a potential for serious side effects to an unborn child. Talk to your health care professional or pharmacist for more information. What side effects may I notice from receiving this medicine? Side effects that you should report to your doctor or health care professional as soon as possible: -allergic reactions like skin rash, itching or hives, swelling of the face, lips, or tongue -breathing problems -chest pain -fast, irregular heartbeat -feeling faint or lightheaded, falls -fever, chills, or any other sign of infection -muscle spasms, tightening, or twitches -numbness or tingling -skin blisters or bumps, or is dry, peels, or red -slow   healing or unexplained pain in the mouth or jaw -unusual bleeding or bruising Side effects that usually do not require medical attention (report to your doctor or health care professional  if they continue or are bothersome): -muscle pain -stomach upset, gas Where should I keep my medicine? This medicine is only given in a clinic, doctor's office, or other health care setting and will not be stored at home.  2017 [employer] Standard (2015-12-26 10:06:55)  

## 2016-12-17 NOTE — Progress Notes (Signed)
Hematology and Oncology Follow Up Visit  Regina Cardenas [id number] 1929-03-14 83 y.o. 12/17/2016   Principle Diagnosis:   Stage IIIA (T3N1M0) invasive ductal carcinoma of the right breast-ER positive/HER-2 negative  Current Therapy:    Status post Docetaxel/Cytoxan cycle #3  Neulasta 6 mg subcutaneous postchemotherapy  Radiation therapy to the right chest wall/axilla  Femara 2.5 mg by mouth daily Prolia 60 mg subcutaneous every 6 months - dose on 12/2016     Interim History:  Regina Cardenas is back for follow-up. She continues to improve. She looks tremendous. She really had a nice holiday season. She had a very quiet  Christmas and [place] holiday weekend. She really did not do all that much. She stays home a lot.   [name] exercising more. [name] more active. Her appetite is doing better. She did not have any problems with nausea or vomiting. There's been no issues with bowels or bladder.   [name] had no problems with arm swelling. [name] had no fevers sweats or chills.   [name] had some hot flashes but this appears be getting a little bit better.   There is no arthralgias from the Femara.   She is on vitamin D.   Overall, her performance status is ECOG 1. .  Medications:  Current Outpatient Prescriptions:  .  Aflibercept (EYLEA IO), Inject into the eye. Every 4 weeks at eye MD, Disp: , Rfl:  .  aspirin 325 MG EC tablet, Take 325 mg by mouth 2 (two) times daily. , Disp: , Rfl:  .  cholecalciferol (VITAMIN D) 1000 UNITS tablet, Take 2,000 Units by mouth daily., Disp: , Rfl:  .  letrozole (FEMARA) 2.5 MG tablet, TAKE 1 TABLET BY MOUTH DAILY, Disp: 90 tablet, Rfl: 3 .  Multiple Vitamin (MULTIVITAMIN) tablet, Take 1 tablet by mouth daily., Disp: , Rfl:  .  Multiple Vitamins-Minerals (PRESERVISION AREDS 2 PO), Take 2 tablets by mouth daily., Disp: , Rfl:  .  non-metallic deodorant (ALRA) MISC, Apply 1 application topically daily as needed., Disp: , Rfl:  .  potassium  chloride (KLOR-CON) 8 MEQ tablet, Take 1 tablet (8 mEq total) by mouth daily., Disp: 14 tablet, Rfl: 0 .  tobramycin-dexamethasone (TOBRADEX) ophthalmic solution, Place 1 drop into the right eye every 4 (four) hours while awake. TAKES ONLY 3 DAYS A MONTH-- 2 DAYS BEFORE AND DAY OF EYE INJECTION, Disp: , Rfl:   Allergies: No Known Allergies  Past Medical History, Surgical history, Social history, and Family History were reviewed and updated.  Review of Systems: As above  Physical Exam:  weight is 150 lb 8 oz (68.3 kg). Her oral temperature is 98.2 F (36.8 C). Her blood pressure is 143/78 (abnormal) and her pulse is 84. Her respiration is 16 and oxygen saturation is 98%.   Wt Readings from Last 3 Encounters:  12/17/16 150 lb 8 oz (68.3 kg)  08/13/16 147 lb 6.4 oz (66.9 kg)  04/14/16 146 lb (66.2 kg)     [name] in no obvious distress. Head and neck exam shows no ocular or oral lesions. She has no palpable cervical or supraclavicular lymph nodes. Thyroid is none palpable. Lungs are clear bilaterally. Cardiac exam regular rate and rhythm with no murmurs, rubs or bruits. Abdomen is soft. She has good bowel sounds. There is no fluid wave. There is no palpable hepatosplenomegaly. Breast exam shows left breast with no masses, edema or erythema. There is no left axillary adenopathy. Right chest wall shows healing mastectomy.  She does have about a 7 x 10 mm area of nodularity that is firm and non-tender area and this is in the center of the mastectomy scar. There also is a second smaller area that probably measures 4 mm more medially. Extremities shows no clubbing, cyanosis or edema. There may be some slight nonpitting edema of the lower right arm. Skin exam shows no rashes, ecchymoses or petechia.  Lab Results  Component Value Date   WBC 5.0 12/17/2016   HGB 14.3 12/17/2016   HCT 42.4 12/17/2016   MCV 90 12/17/2016   PLT 133 (L) 12/17/2016     Chemistry        Component Value Date/Time   NA 140 12/17/2016 1204   NA 138 08/13/2016 1141   K 3.9 12/17/2016 1204   K 4.4 08/13/2016 1141   CL 100 12/17/2016 1204   CO2 29 12/17/2016 1204   CO2 26 08/13/2016 1141   BUN 10 12/17/2016 1204   BUN 13.5 08/13/2016 1141   CREATININE 0.7 12/17/2016 1204   CREATININE 0.8 08/13/2016 1141      Component Value Date/Time   CALCIUM 9.5 12/17/2016 1204   CALCIUM 9.2 08/13/2016 1141   ALKPHOS 58 12/17/2016 1204   ALKPHOS 55 08/13/2016 1141   AST 28 12/17/2016 1204   AST 20 08/13/2016 1141   ALT 20 12/17/2016 1204   ALT 12 08/13/2016 1141   BILITOT 1.30 12/17/2016 1204   BILITOT 0.73 08/13/2016 1141         Impression and Plan: Regina Cardenas is 83 year old postmenopausal white female. She has stage IIIa invasive ductal carcinoma of the right breast. She had a very large tumor. She had 2 lymph nodes that were positive. She had lymphovascular space invasion.  She looks better and better. Her hair is coming back nicely. It is very thick.  Unfortunately, I have to worry about local recurrence. She does have this area at the mastectomy scar which looks suspicious. I'm going to have her surgeon, Dr. Dalbert Batman, take a look at this. I believe this is critical area did  If she does have recurrence, then we will have to consider how we can treat this.  I will like to get her back in one month.  I spent about 30 minutes with her today. I conveyed to her my concern with this area at the mastectomy site.   Regina Napoleon, MD 1/11/20185:49 PM

## 2016-12-22 DIAGNOSIS — H353221 Exudative age-related macular degeneration, left eye, with active choroidal neovascularization: Secondary | ICD-10-CM | POA: Diagnosis not present

## 2016-12-29 ENCOUNTER — Other Ambulatory Visit: Payer: Self-pay | Admitting: General Surgery

## 2016-12-29 DIAGNOSIS — H3530 Unspecified macular degeneration: Secondary | ICD-10-CM | POA: Diagnosis not present

## 2016-12-29 DIAGNOSIS — Z9079 Acquired absence of other genital organ(s): Secondary | ICD-10-CM | POA: Diagnosis not present

## 2016-12-29 DIAGNOSIS — R222 Localized swelling, mass and lump, trunk: Secondary | ICD-10-CM

## 2016-12-29 DIAGNOSIS — Z9011 Acquired absence of right breast and nipple: Secondary | ICD-10-CM

## 2016-12-29 DIAGNOSIS — C50411 Malignant neoplasm of upper-outer quadrant of right female breast: Secondary | ICD-10-CM | POA: Diagnosis not present

## 2016-12-29 DIAGNOSIS — Z90710 Acquired absence of both cervix and uterus: Secondary | ICD-10-CM | POA: Diagnosis not present

## 2016-12-29 DIAGNOSIS — C50011 Malignant neoplasm of nipple and areola, right female breast: Secondary | ICD-10-CM | POA: Diagnosis not present

## 2016-12-29 DIAGNOSIS — Z90722 Acquired absence of ovaries, bilateral: Secondary | ICD-10-CM | POA: Diagnosis not present

## 2016-12-29 DIAGNOSIS — D493 Neoplasm of unspecified behavior of breast: Secondary | ICD-10-CM

## 2016-12-29 DIAGNOSIS — C50911 Malignant neoplasm of unspecified site of right female breast: Secondary | ICD-10-CM | POA: Diagnosis not present

## 2017-01-04 ENCOUNTER — Other Ambulatory Visit: Payer: Medicare Other

## 2017-01-04 ENCOUNTER — Other Ambulatory Visit: Payer: Self-pay | Admitting: General Surgery

## 2017-01-04 DIAGNOSIS — N641 Fat necrosis of breast: Secondary | ICD-10-CM | POA: Diagnosis not present

## 2017-01-04 DIAGNOSIS — R921 Mammographic calcification found on diagnostic imaging of breast: Secondary | ICD-10-CM | POA: Diagnosis not present

## 2017-01-04 DIAGNOSIS — Z853 Personal history of malignant neoplasm of breast: Secondary | ICD-10-CM | POA: Diagnosis not present

## 2017-01-15 ENCOUNTER — Ambulatory Visit (HOSPITAL_BASED_OUTPATIENT_CLINIC_OR_DEPARTMENT_OTHER): Payer: Medicare Other | Admitting: Hematology & Oncology

## 2017-01-15 ENCOUNTER — Other Ambulatory Visit (HOSPITAL_BASED_OUTPATIENT_CLINIC_OR_DEPARTMENT_OTHER): Payer: Medicare Other

## 2017-01-15 ENCOUNTER — Other Ambulatory Visit: Payer: Self-pay | Admitting: *Deleted

## 2017-01-15 VITALS — BP 170/92 | HR 84 | Temp 97.6°F | Resp 16 | Wt 147.2 lb

## 2017-01-15 DIAGNOSIS — C50211 Malignant neoplasm of upper-inner quadrant of right female breast: Secondary | ICD-10-CM | POA: Diagnosis not present

## 2017-01-15 DIAGNOSIS — C50911 Malignant neoplasm of unspecified site of right female breast: Secondary | ICD-10-CM

## 2017-01-15 DIAGNOSIS — M7989 Other specified soft tissue disorders: Secondary | ICD-10-CM | POA: Diagnosis not present

## 2017-01-15 DIAGNOSIS — E559 Vitamin D deficiency, unspecified: Secondary | ICD-10-CM

## 2017-01-15 DIAGNOSIS — Z170 Estrogen receptor positive status [ER+]: Principal | ICD-10-CM

## 2017-01-15 LAB — COMPREHENSIVE METABOLIC PANEL
ALK PHOS: 67 U/L (ref 40–150)
ALT: 13 U/L (ref 0–55)
AST: 22 U/L (ref 5–34)
Albumin: 3.7 g/dL (ref 3.5–5.0)
Anion Gap: 9 mEq/L (ref 3–11)
BUN: 10.9 mg/dL (ref 7.0–26.0)
CALCIUM: 9.5 mg/dL (ref 8.4–10.4)
CHLORIDE: 104 meq/L (ref 98–109)
CO2: 27 mEq/L (ref 22–29)
Creatinine: 0.8 mg/dL (ref 0.6–1.1)
EGFR: 71 mL/min/{1.73_m2} — AB (ref >90)
Glucose: 84 mg/dl (ref 70–140)
POTASSIUM: 4 meq/L (ref 3.5–5.1)
SODIUM: 140 meq/L (ref 136–145)
Total Bilirubin: 0.61 mg/dL (ref 0.20–1.20)
Total Protein: 6.5 g/dL (ref 6.4–8.3)

## 2017-01-15 LAB — CBC WITH DIFFERENTIAL (CANCER CENTER ONLY)
BASO#: 0.1 10*3/uL (ref 0.0–0.2)
BASO%: 1.8 % (ref 0.0–2.0)
EOS ABS: 0.2 10*3/uL (ref 0.0–0.5)
EOS%: 3.8 % (ref 0.0–7.0)
HEMATOCRIT: 42.2 % (ref 34.8–46.6)
HGB: 14 g/dL (ref 11.6–15.9)
LYMPH#: 0.9 10*3/uL (ref 0.9–3.3)
LYMPH%: 21.5 % (ref 14.0–48.0)
MCH: 29.9 pg (ref 26.0–34.0)
MCHC: 33.2 g/dL (ref 32.0–36.0)
MCV: 90 fL (ref 81–101)
MONO#: 0.4 10*3/uL (ref 0.1–0.9)
MONO%: 9.9 % (ref 0.0–13.0)
NEUT#: 2.5 10*3/uL (ref 1.5–6.5)
NEUT%: 63 % (ref 39.6–80.0)
Platelets: 132 10*3/uL — ABNORMAL LOW (ref 145–400)
RBC: 4.69 10*6/uL (ref 3.70–5.32)
RDW: 13.1 % (ref 11.1–15.7)
WBC: 4 10*3/uL (ref 3.9–10.0)

## 2017-01-15 MED ORDER — LETROZOLE 2.5 MG PO TABS: 2.5000 mg | ORAL_TABLET | Freq: Every day | ORAL | 3 refills | Status: AC

## 2017-01-15 NOTE — Progress Notes (Signed)
Hematology and Oncology Follow Up Visit  Regina Cardenas [id number] 01/28/1929 83 y.o. 01/15/2017   Principle Diagnosis:   Stage IIIA (T3N1M0) invasive ductal carcinoma of the right breast-ER positive/HER-2 negative  Current Therapy:    Status post Docetaxel/Cytoxan cycle #3  Neulasta 6 mg subcutaneous postchemotherapy  Radiation therapy to the right chest wall/axilla  Femara 2.5 mg by mouth daily Prolia 60 mg subcutaneous every 6 months - dose on 12/2016     Interim History:  Regina Cardenas is back for follow-up. I must say that this is truly a miracle with what we got from the path report. I thought for sure that she had local recurrence at the mastectomy site. I sent her to Dr. Dalbert Batman. He also felt fairly confident that she had local recurrence.  He took her to surgery on January 29. Shockingly, the pathology report ([id number]) showed no malignancy. She had fat necrosis and fibrosis. There was post treatment effect. I am absolutely amazed by this. I'm also very relieved by this.  She really looks wonderful. She has had no problems with nausea or vomiting. [name] had no pain. [name] had no cough. [name] had no obvious change in bowel or bladder habits.   Overall, her performance status is ECOG 1. .  Medications:  Current Outpatient Prescriptions:  .  Aflibercept (EYLEA IO), Inject into the eye. Every 4 weeks at eye MD, Disp: , Rfl:  .  aspirin 81 MG chewable tablet, Chew 81 mg by mouth daily., Disp: , Rfl:  .  cholecalciferol (VITAMIN D) 1000 UNITS tablet, Take 2,000 Units by mouth daily., Disp: , Rfl:  .  letrozole (FEMARA) 2.5 MG tablet, TAKE 1 TABLET BY MOUTH DAILY, Disp: 90 tablet, Rfl: 3 .  Multiple Vitamin (MULTIVITAMIN) tablet, Take 1 tablet by mouth daily., Disp: , Rfl:  .  Multiple Vitamins-Minerals (PRESERVISION AREDS 2 PO), Take 2 tablets by mouth daily., Disp: , Rfl:  .  non-metallic deodorant (ALRA) MISC, Apply 1 application topically daily as needed., Disp: , Rfl:   .  potassium chloride (KLOR-CON) 8 MEQ tablet, Take 1 tablet (8 mEq total) by mouth daily., Disp: 14 tablet, Rfl: 0 .  tobramycin-dexamethasone (TOBRADEX) ophthalmic solution, Place 1 drop into the right eye every 4 (four) hours while awake. TAKES ONLY 3 DAYS A MONTH-- 2 DAYS BEFORE AND DAY OF EYE INJECTION, Disp: , Rfl:   Allergies: No Known Allergies  Past Medical History, Surgical history, Social history, and Family History were reviewed and updated.  Review of Systems: As above  Physical Exam:  weight is 147 lb 4 oz (66.8 kg). Her oral temperature is 97.6 F (36.4 C). Her blood pressure is 170/92 (abnormal) and her pulse is 84. Her respiration is 16 and oxygen saturation is 98%.   Wt Readings from Last 3 Encounters:  01/15/17 147 lb 4 oz (66.8 kg)  12/17/16 150 lb 8 oz (68.3 kg)  08/13/16 147 lb 6.4 oz (66.9 kg)     [name] in no obvious distress. Head and neck exam shows no ocular or oral lesions. She has no palpable cervical or supraclavicular lymph nodes. Thyroid is none palpable. Lungs are clear bilaterally. Cardiac exam regular rate and rhythm with no murmurs, rubs or bruits. Abdomen is soft. She has good bowel sounds. There is no fluid wave. There is no palpable hepatosplenomegaly. Breast exam shows left breast with no masses, edema or erythema. There is no left axillary adenopathy. Right chest wall shows healing mastectomy. She does  have about a 7 x 10 mm area of nodularity that is firm and non-tender area and this is in the center of the mastectomy scar. There also is a second smaller area that probably measures 4 mm more medially. Extremities shows no clubbing, cyanosis or edema. There may be some slight nonpitting edema of the lower right arm. Skin exam shows no rashes, ecchymoses or petechia.  Lab Results  Component Value Date   WBC 4.0 01/15/2017   HGB 14.0 01/15/2017   HCT 42.2 01/15/2017   MCV 90 01/15/2017   PLT 132 (L) 01/15/2017      Chemistry      Component Value Date/Time   NA 140 12/17/2016 1204   NA 138 08/13/2016 1141   K 3.9 12/17/2016 1204   K 4.4 08/13/2016 1141   CL 100 12/17/2016 1204   CO2 29 12/17/2016 1204   CO2 26 08/13/2016 1141   BUN 10 12/17/2016 1204   BUN 13.5 08/13/2016 1141   CREATININE 0.7 12/17/2016 1204   CREATININE 0.8 08/13/2016 1141      Component Value Date/Time   CALCIUM 9.5 12/17/2016 1204   CALCIUM 9.2 08/13/2016 1141   ALKPHOS 58 12/17/2016 1204   ALKPHOS 55 08/13/2016 1141   AST 28 12/17/2016 1204   AST 20 08/13/2016 1141   ALT 20 12/17/2016 1204   ALT 12 08/13/2016 1141   BILITOT 1.30 12/17/2016 1204   BILITOT 0.73 08/13/2016 1141         Impression and Plan: Regina Cardenas is 83 year old postmenopausal white female. She has stage IIIa invasive ductal carcinoma of the right breast. She had a very large tumor. She had 2 lymph nodes that were positive. She had lymphovascular space invasion.  Again, I am just very happy that she did not have any obvious recurrence. I am just very pleased with this.  We still have to watch. I still think that she has a risk of recurrence.  We will get her back in 2 months. I think evolves good in 2 months, then we will get her back on a regular schedule.    I spent about 25 minutes with her today. I reviewed the pathology report.   Volanda Napoleon, MD 2/9/20189:36 AM

## 2017-01-16 LAB — CANCER ANTIGEN 27.29: CA 27.29: 18.1 U/mL (ref 0.0–38.6)

## 2017-01-20 ENCOUNTER — Ambulatory Visit: Payer: Medicare Other | Admitting: Hematology & Oncology

## 2017-01-20 ENCOUNTER — Other Ambulatory Visit: Payer: Medicare Other

## 2017-01-25 ENCOUNTER — Ambulatory Visit
Admission: RE | Admit: 2017-01-25 | Discharge: 2017-01-25 | Disposition: A | Discharge status: Home / Self Care | Payer: Medicare Other | Source: Ambulatory Visit | Attending: General Surgery | Admitting: General Surgery

## 2017-01-25 DIAGNOSIS — Z9011 Acquired absence of right breast and nipple: Secondary | ICD-10-CM

## 2017-01-25 DIAGNOSIS — R222 Localized swelling, mass and lump, trunk: Secondary | ICD-10-CM

## 2017-01-25 DIAGNOSIS — D493 Neoplasm of unspecified behavior of breast: Secondary | ICD-10-CM

## 2017-01-25 DIAGNOSIS — R928 Other abnormal and inconclusive findings on diagnostic imaging of breast: Secondary | ICD-10-CM | POA: Diagnosis not present

## 2017-01-27 DIAGNOSIS — H353211 Exudative age-related macular degeneration, right eye, with active choroidal neovascularization: Secondary | ICD-10-CM | POA: Diagnosis not present

## 2017-01-27 DIAGNOSIS — H353231 Exudative age-related macular degeneration, bilateral, with active choroidal neovascularization: Secondary | ICD-10-CM | POA: Diagnosis not present

## 2017-01-27 DIAGNOSIS — H43813 Vitreous degeneration, bilateral: Secondary | ICD-10-CM | POA: Diagnosis not present

## 2017-02-03 DIAGNOSIS — H353221 Exudative age-related macular degeneration, left eye, with active choroidal neovascularization: Secondary | ICD-10-CM | POA: Diagnosis not present

## 2017-03-17 DIAGNOSIS — H353231 Exudative age-related macular degeneration, bilateral, with active choroidal neovascularization: Secondary | ICD-10-CM | POA: Diagnosis not present

## 2017-03-17 DIAGNOSIS — H43813 Vitreous degeneration, bilateral: Secondary | ICD-10-CM | POA: Diagnosis not present

## 2017-03-18 ENCOUNTER — Ambulatory Visit (HOSPITAL_BASED_OUTPATIENT_CLINIC_OR_DEPARTMENT_OTHER): Payer: Medicare Other | Admitting: Hematology & Oncology

## 2017-03-18 ENCOUNTER — Other Ambulatory Visit (HOSPITAL_BASED_OUTPATIENT_CLINIC_OR_DEPARTMENT_OTHER): Payer: Medicare Other

## 2017-03-18 VITALS — BP 137/68 | HR 68 | Temp 98.1°F | Resp 20 | Wt 147.8 lb

## 2017-03-18 DIAGNOSIS — T386X5A Adverse effect of antigonadotrophins, antiestrogens, antiandrogens, not elsewhere classified, initial encounter: Secondary | ICD-10-CM

## 2017-03-18 DIAGNOSIS — C779 Secondary and unspecified malignant neoplasm of lymph node, unspecified: Secondary | ICD-10-CM

## 2017-03-18 DIAGNOSIS — C50211 Malignant neoplasm of upper-inner quadrant of right female breast: Secondary | ICD-10-CM

## 2017-03-18 DIAGNOSIS — M818 Other osteoporosis without current pathological fracture: Secondary | ICD-10-CM

## 2017-03-18 DIAGNOSIS — Z170 Estrogen receptor positive status [ER+]: Secondary | ICD-10-CM | POA: Diagnosis not present

## 2017-03-18 LAB — LACTATE DEHYDROGENASE: LDH: 204 U/L (ref 125–245)

## 2017-03-18 LAB — CBC WITH DIFFERENTIAL (CANCER CENTER ONLY)
BASO#: 0.1 10*3/uL (ref 0.0–0.2)
BASO%: 1.1 % (ref 0.0–2.0)
EOS%: 2.5 % (ref 0.0–7.0)
Eosinophils Absolute: 0.1 10*3/uL (ref 0.0–0.5)
HEMATOCRIT: 41.9 % (ref 34.8–46.6)
HEMOGLOBIN: 14.1 g/dL (ref 11.6–15.9)
LYMPH#: 1 10*3/uL (ref 0.9–3.3)
LYMPH%: 17.9 % (ref 14.0–48.0)
MCH: 30.1 pg (ref 26.0–34.0)
MCHC: 33.7 g/dL (ref 32.0–36.0)
MCV: 89 fL (ref 81–101)
MONO#: 0.5 10*3/uL (ref 0.1–0.9)
MONO%: 8 % (ref 0.0–13.0)
NEUT%: 70.5 % (ref 39.6–80.0)
NEUTROS ABS: 4 10*3/uL (ref 1.5–6.5)
Platelets: 135 10*3/uL — ABNORMAL LOW (ref 145–400)
RBC: 4.69 10*6/uL (ref 3.70–5.32)
RDW: 13.8 % (ref 11.1–15.7)
WBC: 5.7 10*3/uL (ref 3.9–10.0)

## 2017-03-18 LAB — CMP (CANCER CENTER ONLY)
ALK PHOS: 61 U/L (ref 26–84)
ALT: 17 U/L (ref 10–47)
AST: 26 U/L (ref 11–38)
Albumin: 3.7 g/dL (ref 3.3–5.5)
BUN: 13 mg/dL (ref 7–22)
CHLORIDE: 100 meq/L (ref 98–108)
CO2: 30 mEq/L (ref 18–33)
CREATININE: 0.7 mg/dL (ref 0.6–1.2)
Calcium: 9.7 mg/dL (ref 8.0–10.3)
Glucose, Bld: 92 mg/dL (ref 73–118)
Potassium: 3.8 mEq/L (ref 3.3–4.7)
SODIUM: 137 meq/L (ref 128–145)
TOTAL PROTEIN: 6.3 g/dL — AB (ref 6.4–8.1)
Total Bilirubin: 1.3 mg/dl (ref 0.20–1.60)
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
## 2017-03-19 LAB — CANCER ANTIGEN 27.29: CA 27.29: 22 U/mL (ref 0.0–38.6)

## 2017-03-24 DIAGNOSIS — H353221 Exudative age-related macular degeneration, left eye, with active choroidal neovascularization: Secondary | ICD-10-CM | POA: Diagnosis not present

## 2017-04-09 DIAGNOSIS — H353230 Exudative age-related macular degeneration, bilateral, stage unspecified: Secondary | ICD-10-CM | POA: Diagnosis not present

## 2017-04-09 DIAGNOSIS — H43813 Vitreous degeneration, bilateral: Secondary | ICD-10-CM | POA: Diagnosis not present

## 2017-04-09 DIAGNOSIS — H25812 Combined forms of age-related cataract, left eye: Secondary | ICD-10-CM | POA: Diagnosis not present

## 2017-04-09 DIAGNOSIS — H524 Presbyopia: Secondary | ICD-10-CM | POA: Diagnosis not present

## 2017-05-05 DIAGNOSIS — H43813 Vitreous degeneration, bilateral: Secondary | ICD-10-CM | POA: Diagnosis not present

## 2017-05-05 DIAGNOSIS — H353231 Exudative age-related macular degeneration, bilateral, with active choroidal neovascularization: Secondary | ICD-10-CM | POA: Diagnosis not present

## 2017-05-12 DIAGNOSIS — H353231 Exudative age-related macular degeneration, bilateral, with active choroidal neovascularization: Secondary | ICD-10-CM | POA: Diagnosis not present

## 2017-06-25 DIAGNOSIS — H353231 Exudative age-related macular degeneration, bilateral, with active choroidal neovascularization: Secondary | ICD-10-CM | POA: Diagnosis not present

## 2017-06-25 DIAGNOSIS — H43813 Vitreous degeneration, bilateral: Secondary | ICD-10-CM | POA: Diagnosis not present

## 2017-07-22 ENCOUNTER — Ambulatory Visit: Payer: Medicare Other | Admitting: Hematology & Oncology

## 2017-07-22 ENCOUNTER — Ambulatory Visit: Payer: Medicare Other

## 2017-07-22 ENCOUNTER — Other Ambulatory Visit: Payer: Medicare Other

## 2017-08-13 DIAGNOSIS — H2512 Age-related nuclear cataract, left eye: Secondary | ICD-10-CM | POA: Diagnosis not present

## 2017-08-13 DIAGNOSIS — H43813 Vitreous degeneration, bilateral: Secondary | ICD-10-CM | POA: Diagnosis not present

## 2017-08-13 DIAGNOSIS — H353211 Exudative age-related macular degeneration, right eye, with active choroidal neovascularization: Secondary | ICD-10-CM | POA: Diagnosis not present

## 2017-08-13 DIAGNOSIS — H353222 Exudative age-related macular degeneration, left eye, with inactive choroidal neovascularization: Secondary | ICD-10-CM | POA: Diagnosis not present

## 2017-10-05 DIAGNOSIS — H43813 Vitreous degeneration, bilateral: Secondary | ICD-10-CM | POA: Diagnosis not present

## 2017-10-05 DIAGNOSIS — H353231 Exudative age-related macular degeneration, bilateral, with active choroidal neovascularization: Secondary | ICD-10-CM | POA: Diagnosis not present

## 2017-10-05 DIAGNOSIS — H2512 Age-related nuclear cataract, left eye: Secondary | ICD-10-CM | POA: Diagnosis not present

## 2017-10-19 DIAGNOSIS — H353231 Exudative age-related macular degeneration, bilateral, with active choroidal neovascularization: Secondary | ICD-10-CM | POA: Diagnosis not present

## 2017-11-16 DIAGNOSIS — H353231 Exudative age-related macular degeneration, bilateral, with active choroidal neovascularization: Secondary | ICD-10-CM | POA: Diagnosis not present

## 2017-11-23 DIAGNOSIS — H2512 Age-related nuclear cataract, left eye: Secondary | ICD-10-CM | POA: Diagnosis not present

## 2017-11-23 DIAGNOSIS — H353231 Exudative age-related macular degeneration, bilateral, with active choroidal neovascularization: Secondary | ICD-10-CM | POA: Diagnosis not present

## 2017-11-23 DIAGNOSIS — H43813 Vitreous degeneration, bilateral: Secondary | ICD-10-CM | POA: Diagnosis not present

## 2018-01-04 DIAGNOSIS — H353231 Exudative age-related macular degeneration, bilateral, with active choroidal neovascularization: Secondary | ICD-10-CM | POA: Diagnosis not present

## 2018-01-07 DIAGNOSIS — H353231 Exudative age-related macular degeneration, bilateral, with active choroidal neovascularization: Secondary | ICD-10-CM | POA: Diagnosis not present

## 2018-01-07 DIAGNOSIS — H43813 Vitreous degeneration, bilateral: Secondary | ICD-10-CM | POA: Diagnosis not present

## 2018-01-07 DIAGNOSIS — H2512 Age-related nuclear cataract, left eye: Secondary | ICD-10-CM | POA: Diagnosis not present

## 2018-01-21 ENCOUNTER — Other Ambulatory Visit: Payer: Self-pay | Admitting: General Surgery

## 2018-01-21 DIAGNOSIS — Z1231 Encounter for screening mammogram for malignant neoplasm of breast: Secondary | ICD-10-CM

## 2018-02-09 ENCOUNTER — Other Ambulatory Visit: Payer: Self-pay | Admitting: General Surgery

## 2018-02-09 ENCOUNTER — Ambulatory Visit
Admission: RE | Admit: 2018-02-09 | Discharge: 2018-02-09 | Disposition: A | Discharge status: Home / Self Care | Payer: Medicare Other | Source: Ambulatory Visit | Attending: General Surgery | Admitting: General Surgery

## 2018-02-09 DIAGNOSIS — Z1231 Encounter for screening mammogram for malignant neoplasm of breast: Secondary | ICD-10-CM

## 2018-03-04 DIAGNOSIS — H353221 Exudative age-related macular degeneration, left eye, with active choroidal neovascularization: Secondary | ICD-10-CM | POA: Diagnosis not present

## 2018-03-11 DIAGNOSIS — H2512 Age-related nuclear cataract, left eye: Secondary | ICD-10-CM | POA: Diagnosis not present

## 2018-03-11 DIAGNOSIS — H353222 Exudative age-related macular degeneration, left eye, with inactive choroidal neovascularization: Secondary | ICD-10-CM | POA: Diagnosis not present

## 2018-03-11 DIAGNOSIS — H43813 Vitreous degeneration, bilateral: Secondary | ICD-10-CM | POA: Diagnosis not present

## 2018-03-11 DIAGNOSIS — H353211 Exudative age-related macular degeneration, right eye, with active choroidal neovascularization: Secondary | ICD-10-CM | POA: Diagnosis not present

## 2018-04-27 DIAGNOSIS — H353222 Exudative age-related macular degeneration, left eye, with inactive choroidal neovascularization: Secondary | ICD-10-CM | POA: Diagnosis not present

## 2018-04-27 DIAGNOSIS — H353211 Exudative age-related macular degeneration, right eye, with active choroidal neovascularization: Secondary | ICD-10-CM | POA: Diagnosis not present

## 2018-04-27 DIAGNOSIS — H43813 Vitreous degeneration, bilateral: Secondary | ICD-10-CM | POA: Diagnosis not present

## 2018-04-27 DIAGNOSIS — H2512 Age-related nuclear cataract, left eye: Secondary | ICD-10-CM | POA: Diagnosis not present

## 2018-06-03 DIAGNOSIS — H353222 Exudative age-related macular degeneration, left eye, with inactive choroidal neovascularization: Secondary | ICD-10-CM | POA: Diagnosis not present

## 2018-06-03 DIAGNOSIS — H2512 Age-related nuclear cataract, left eye: Secondary | ICD-10-CM | POA: Diagnosis not present

## 2018-06-03 DIAGNOSIS — H353211 Exudative age-related macular degeneration, right eye, with active choroidal neovascularization: Secondary | ICD-10-CM | POA: Diagnosis not present

## 2018-06-03 DIAGNOSIS — H43813 Vitreous degeneration, bilateral: Secondary | ICD-10-CM | POA: Diagnosis not present

## 2018-07-20 DIAGNOSIS — H353211 Exudative age-related macular degeneration, right eye, with active choroidal neovascularization: Secondary | ICD-10-CM | POA: Diagnosis not present

## 2018-07-20 DIAGNOSIS — H43813 Vitreous degeneration, bilateral: Secondary | ICD-10-CM | POA: Diagnosis not present

## 2018-07-20 DIAGNOSIS — H353222 Exudative age-related macular degeneration, left eye, with inactive choroidal neovascularization: Secondary | ICD-10-CM | POA: Diagnosis not present

## 2018-07-20 DIAGNOSIS — H2512 Age-related nuclear cataract, left eye: Secondary | ICD-10-CM | POA: Diagnosis not present

## 2018-09-14 DIAGNOSIS — H353211 Exudative age-related macular degeneration, right eye, with active choroidal neovascularization: Secondary | ICD-10-CM | POA: Diagnosis not present

## 2018-09-14 DIAGNOSIS — H2512 Age-related nuclear cataract, left eye: Secondary | ICD-10-CM | POA: Diagnosis not present

## 2018-09-14 DIAGNOSIS — H43813 Vitreous degeneration, bilateral: Secondary | ICD-10-CM | POA: Diagnosis not present

## 2018-09-14 DIAGNOSIS — H353222 Exudative age-related macular degeneration, left eye, with inactive choroidal neovascularization: Secondary | ICD-10-CM | POA: Diagnosis not present

## 2018-10-06 DIAGNOSIS — C773 Secondary and unspecified malignant neoplasm of axilla and upper limb lymph nodes: Secondary | ICD-10-CM | POA: Diagnosis not present

## 2018-10-06 DIAGNOSIS — D0511 Intraductal carcinoma in situ of right breast: Secondary | ICD-10-CM | POA: Diagnosis not present

## 2018-10-06 DIAGNOSIS — Z170 Estrogen receptor positive status [ER+]: Secondary | ICD-10-CM | POA: Diagnosis not present

## 2018-10-06 DIAGNOSIS — N641 Fat necrosis of breast: Secondary | ICD-10-CM | POA: Diagnosis not present

## 2018-10-06 DIAGNOSIS — C50912 Malignant neoplasm of unspecified site of left female breast: Secondary | ICD-10-CM | POA: Diagnosis not present

## 2018-10-06 DIAGNOSIS — C50911 Malignant neoplasm of unspecified site of right female breast: Secondary | ICD-10-CM | POA: Diagnosis not present

## 2018-10-12 DIAGNOSIS — Z170 Estrogen receptor positive status [ER+]: Secondary | ICD-10-CM | POA: Diagnosis not present

## 2018-10-12 DIAGNOSIS — C50411 Malignant neoplasm of upper-outer quadrant of right female breast: Secondary | ICD-10-CM | POA: Diagnosis not present

## 2018-10-13 DIAGNOSIS — Z170 Estrogen receptor positive status [ER+]: Secondary | ICD-10-CM | POA: Diagnosis not present

## 2018-10-13 DIAGNOSIS — D0511 Intraductal carcinoma in situ of right breast: Secondary | ICD-10-CM | POA: Diagnosis not present

## 2018-10-13 DIAGNOSIS — C50411 Malignant neoplasm of upper-outer quadrant of right female breast: Secondary | ICD-10-CM | POA: Diagnosis not present

## 2018-10-13 DIAGNOSIS — C50011 Malignant neoplasm of nipple and areola, right female breast: Secondary | ICD-10-CM | POA: Diagnosis not present

## 2018-10-19 DIAGNOSIS — Z170 Estrogen receptor positive status [ER+]: Secondary | ICD-10-CM | POA: Diagnosis not present

## 2018-10-19 DIAGNOSIS — M8580 Other specified disorders of bone density and structure, unspecified site: Secondary | ICD-10-CM | POA: Diagnosis not present

## 2018-10-19 DIAGNOSIS — Z13820 Encounter for screening for osteoporosis: Secondary | ICD-10-CM | POA: Diagnosis not present

## 2018-10-19 DIAGNOSIS — M8589 Other specified disorders of bone density and structure, multiple sites: Secondary | ICD-10-CM | POA: Diagnosis not present

## 2018-10-19 DIAGNOSIS — C50411 Malignant neoplasm of upper-outer quadrant of right female breast: Secondary | ICD-10-CM | POA: Diagnosis not present

## 2018-10-19 DIAGNOSIS — Z780 Asymptomatic menopausal state: Secondary | ICD-10-CM | POA: Diagnosis not present

## 2018-10-19 DIAGNOSIS — S52021A Displaced fracture of olecranon process without intraarticular extension of right ulna, initial encounter for closed fracture: Secondary | ICD-10-CM | POA: Diagnosis not present

## 2018-11-09 DIAGNOSIS — H43813 Vitreous degeneration, bilateral: Secondary | ICD-10-CM | POA: Diagnosis not present

## 2018-11-09 DIAGNOSIS — H2512 Age-related nuclear cataract, left eye: Secondary | ICD-10-CM | POA: Diagnosis not present

## 2018-11-09 DIAGNOSIS — H353211 Exudative age-related macular degeneration, right eye, with active choroidal neovascularization: Secondary | ICD-10-CM | POA: Diagnosis not present

## 2018-11-09 DIAGNOSIS — H353222 Exudative age-related macular degeneration, left eye, with inactive choroidal neovascularization: Secondary | ICD-10-CM | POA: Diagnosis not present

## 2018-11-15 DIAGNOSIS — M25531 Pain in right wrist: Secondary | ICD-10-CM | POA: Diagnosis not present

## 2018-11-15 DIAGNOSIS — M25521 Pain in right elbow: Secondary | ICD-10-CM | POA: Diagnosis not present

## 2019-01-04 DIAGNOSIS — H353222 Exudative age-related macular degeneration, left eye, with inactive choroidal neovascularization: Secondary | ICD-10-CM | POA: Diagnosis not present

## 2019-01-04 DIAGNOSIS — H353211 Exudative age-related macular degeneration, right eye, with active choroidal neovascularization: Secondary | ICD-10-CM | POA: Diagnosis not present

## 2019-01-04 DIAGNOSIS — H2512 Age-related nuclear cataract, left eye: Secondary | ICD-10-CM | POA: Diagnosis not present

## 2019-01-04 DIAGNOSIS — H43813 Vitreous degeneration, bilateral: Secondary | ICD-10-CM | POA: Diagnosis not present

## 2019-01-10 DIAGNOSIS — M8589 Other specified disorders of bone density and structure, multiple sites: Secondary | ICD-10-CM | POA: Diagnosis not present

## 2019-01-10 DIAGNOSIS — C50411 Malignant neoplasm of upper-outer quadrant of right female breast: Secondary | ICD-10-CM | POA: Diagnosis not present

## 2019-01-10 DIAGNOSIS — N63 Unspecified lump in unspecified breast: Secondary | ICD-10-CM | POA: Diagnosis not present

## 2019-01-10 DIAGNOSIS — N6310 Unspecified lump in the right breast, unspecified quadrant: Secondary | ICD-10-CM | POA: Diagnosis not present

## 2019-01-10 DIAGNOSIS — Z170 Estrogen receptor positive status [ER+]: Secondary | ICD-10-CM | POA: Diagnosis not present

## 2019-01-12 DIAGNOSIS — N6489 Other specified disorders of breast: Secondary | ICD-10-CM | POA: Diagnosis not present

## 2019-01-12 DIAGNOSIS — N6001 Solitary cyst of right breast: Secondary | ICD-10-CM | POA: Diagnosis not present

## 2019-01-12 DIAGNOSIS — N6315 Unspecified lump in the right breast, overlapping quadrants: Secondary | ICD-10-CM | POA: Diagnosis not present

## 2019-04-12 DIAGNOSIS — H2512 Age-related nuclear cataract, left eye: Secondary | ICD-10-CM | POA: Diagnosis not present

## 2019-04-12 DIAGNOSIS — H353231 Exudative age-related macular degeneration, bilateral, with active choroidal neovascularization: Secondary | ICD-10-CM | POA: Diagnosis not present

## 2019-04-12 DIAGNOSIS — H43813 Vitreous degeneration, bilateral: Secondary | ICD-10-CM | POA: Diagnosis not present

## 2019-04-18 DIAGNOSIS — H524 Presbyopia: Secondary | ICD-10-CM | POA: Diagnosis not present

## 2019-04-18 DIAGNOSIS — H43813 Vitreous degeneration, bilateral: Secondary | ICD-10-CM | POA: Diagnosis not present

## 2019-04-18 DIAGNOSIS — H353231 Exudative age-related macular degeneration, bilateral, with active choroidal neovascularization: Secondary | ICD-10-CM | POA: Diagnosis not present

## 2019-04-18 DIAGNOSIS — H25812 Combined forms of age-related cataract, left eye: Secondary | ICD-10-CM | POA: Diagnosis not present

## 2019-04-20 DIAGNOSIS — Z961 Presence of intraocular lens: Secondary | ICD-10-CM | POA: Diagnosis not present

## 2019-04-20 DIAGNOSIS — H35363 Drusen (degenerative) of macula, bilateral: Secondary | ICD-10-CM | POA: Diagnosis not present

## 2019-04-20 DIAGNOSIS — H25812 Combined forms of age-related cataract, left eye: Secondary | ICD-10-CM | POA: Diagnosis not present

## 2019-04-20 DIAGNOSIS — H353221 Exudative age-related macular degeneration, left eye, with active choroidal neovascularization: Secondary | ICD-10-CM | POA: Diagnosis not present

## 2019-04-20 DIAGNOSIS — H35453 Secondary pigmentary degeneration, bilateral: Secondary | ICD-10-CM | POA: Diagnosis not present

## 2019-04-20 DIAGNOSIS — H3122 Choroidal dystrophy (central areolar) (generalized) (peripapillary): Secondary | ICD-10-CM | POA: Diagnosis not present

## 2019-04-20 DIAGNOSIS — H353211 Exudative age-related macular degeneration, right eye, with active choroidal neovascularization: Secondary | ICD-10-CM | POA: Diagnosis not present

## 2019-04-20 DIAGNOSIS — H3562 Retinal hemorrhage, left eye: Secondary | ICD-10-CM | POA: Diagnosis not present

## 2019-04-24 DIAGNOSIS — H353221 Exudative age-related macular degeneration, left eye, with active choroidal neovascularization: Secondary | ICD-10-CM | POA: Diagnosis not present

## 2019-05-22 DIAGNOSIS — H353211 Exudative age-related macular degeneration, right eye, with active choroidal neovascularization: Secondary | ICD-10-CM | POA: Diagnosis not present

## 2019-05-29 DIAGNOSIS — H353221 Exudative age-related macular degeneration, left eye, with active choroidal neovascularization: Secondary | ICD-10-CM | POA: Diagnosis not present

## 2019-07-03 DIAGNOSIS — H353211 Exudative age-related macular degeneration, right eye, with active choroidal neovascularization: Secondary | ICD-10-CM | POA: Diagnosis not present

## 2019-07-05 DIAGNOSIS — H353221 Exudative age-related macular degeneration, left eye, with active choroidal neovascularization: Secondary | ICD-10-CM | POA: Diagnosis not present

## 2019-08-01 DIAGNOSIS — C50911 Malignant neoplasm of unspecified site of right female breast: Secondary | ICD-10-CM | POA: Diagnosis not present

## 2019-08-01 DIAGNOSIS — Z79811 Long term (current) use of aromatase inhibitors: Secondary | ICD-10-CM | POA: Diagnosis not present

## 2019-08-01 DIAGNOSIS — C50411 Malignant neoplasm of upper-outer quadrant of right female breast: Secondary | ICD-10-CM | POA: Diagnosis not present

## 2019-08-01 DIAGNOSIS — Z170 Estrogen receptor positive status [ER+]: Secondary | ICD-10-CM | POA: Diagnosis not present

## 2019-08-01 DIAGNOSIS — M8589 Other specified disorders of bone density and structure, multiple sites: Secondary | ICD-10-CM | POA: Diagnosis not present

## 2019-08-01 DIAGNOSIS — Z5181 Encounter for therapeutic drug level monitoring: Secondary | ICD-10-CM | POA: Diagnosis not present

## 2019-08-07 DIAGNOSIS — H353211 Exudative age-related macular degeneration, right eye, with active choroidal neovascularization: Secondary | ICD-10-CM | POA: Diagnosis not present

## 2019-08-10 DIAGNOSIS — H353221 Exudative age-related macular degeneration, left eye, with active choroidal neovascularization: Secondary | ICD-10-CM | POA: Diagnosis not present

## 2019-09-08 DIAGNOSIS — H353211 Exudative age-related macular degeneration, right eye, with active choroidal neovascularization: Secondary | ICD-10-CM | POA: Diagnosis not present

## 2019-09-15 DIAGNOSIS — H353221 Exudative age-related macular degeneration, left eye, with active choroidal neovascularization: Secondary | ICD-10-CM | POA: Diagnosis not present

## 2019-10-13 DIAGNOSIS — H353211 Exudative age-related macular degeneration, right eye, with active choroidal neovascularization: Secondary | ICD-10-CM | POA: Diagnosis not present

## 2019-10-19 DIAGNOSIS — H353221 Exudative age-related macular degeneration, left eye, with active choroidal neovascularization: Secondary | ICD-10-CM | POA: Diagnosis not present

## 2019-10-23 DIAGNOSIS — Z170 Estrogen receptor positive status [ER+]: Secondary | ICD-10-CM | POA: Diagnosis not present

## 2019-10-23 DIAGNOSIS — M810 Age-related osteoporosis without current pathological fracture: Secondary | ICD-10-CM | POA: Diagnosis not present

## 2019-10-23 DIAGNOSIS — C50411 Malignant neoplasm of upper-outer quadrant of right female breast: Secondary | ICD-10-CM | POA: Diagnosis not present

## 2019-11-07 DIAGNOSIS — M810 Age-related osteoporosis without current pathological fracture: Secondary | ICD-10-CM | POA: Diagnosis not present

## 2019-11-07 DIAGNOSIS — Z79811 Long term (current) use of aromatase inhibitors: Secondary | ICD-10-CM | POA: Diagnosis not present

## 2019-11-07 DIAGNOSIS — S52021D Displaced fracture of olecranon process without intraarticular extension of right ulna, subsequent encounter for closed fracture with routine healing: Secondary | ICD-10-CM | POA: Diagnosis not present

## 2019-11-07 DIAGNOSIS — M8589 Other specified disorders of bone density and structure, multiple sites: Secondary | ICD-10-CM | POA: Diagnosis not present

## 2019-11-07 DIAGNOSIS — Z170 Estrogen receptor positive status [ER+]: Secondary | ICD-10-CM | POA: Diagnosis not present

## 2019-11-07 DIAGNOSIS — C50411 Malignant neoplasm of upper-outer quadrant of right female breast: Secondary | ICD-10-CM | POA: Diagnosis not present

## 2021-10-29 ENCOUNTER — Other Ambulatory Visit (HOSPITAL_BASED_OUTPATIENT_CLINIC_OR_DEPARTMENT_OTHER): Payer: Self-pay

## 2021-10-29 ENCOUNTER — Encounter: Payer: Self-pay | Admitting: Hematology & Oncology

## 2021-10-29 ENCOUNTER — Ambulatory Visit: Payer: Medicare Other | Attending: Internal Medicine

## 2021-10-29 DIAGNOSIS — Z23 Encounter for immunization: Secondary | ICD-10-CM

## 2021-10-29 MED ORDER — MODERNA COVID-19 BIVAL BOOSTER 50 MCG/0.5ML IM SUSP
INTRAMUSCULAR | 0 refills | Status: AC
  Filled 2021-10-29: qty 0.5, 1d supply, fill #0

## 2021-10-29 NOTE — Progress Notes (Signed)
   Covid-19 Vaccination Clinic  Name:  Regina Cardenas    MRN: [id number] DOB: Apr 10, 1929  10/29/2021  [name] was observed post Covid-19 immunization for 15 minutes without incident. She was provided with Vaccine Information Sheet and instruction to access the V-Safe system.   [name] was instructed to call 911 with any severe reactions post vaccine: Difficulty breathing  Swelling of face and throat  A fast heartbeat  A bad rash all over body  Dizziness and weakness   Immunizations Administered     Name Date Dose VIS Date Route   Moderna Covid-19 vaccine Bivalent Booster 10/29/2021  9:05 AM 0.5 mL 07/19/2021 Intramuscular   Manufacturer: Moderna   Lot: [id number]   [place]: [id number]

## 2023-12-03 ENCOUNTER — Other Ambulatory Visit (HOSPITAL_BASED_OUTPATIENT_CLINIC_OR_DEPARTMENT_OTHER): Payer: Self-pay
